# Patient Record
Sex: Male | Born: 1965 | Race: Black or African American | Hispanic: No | Marital: Married | Smoking: Never smoker
Health system: Southern US, Community
[De-identification: ages and names within clinical notes are randomized; demographics above are authoritative.]

## PROBLEM LIST (undated history)

## (undated) DIAGNOSIS — Z789 Other specified health status: Secondary | ICD-10-CM

## (undated) DIAGNOSIS — E7800 Pure hypercholesterolemia, unspecified: Secondary | ICD-10-CM

## (undated) DIAGNOSIS — E119 Type 2 diabetes mellitus without complications: Secondary | ICD-10-CM

---
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
## 1998-08-20 ENCOUNTER — Encounter: Payer: Self-pay | Admitting: Family Medicine

## 1998-08-20 ENCOUNTER — Ambulatory Visit (HOSPITAL_COMMUNITY): Admission: RE | Admit: 1998-08-20 | Discharge: 1998-08-20 | Payer: Self-pay | Admitting: Obstetrics and Gynecology

## 2002-01-10 ENCOUNTER — Encounter: Admission: RE | Admit: 2002-01-10 | Discharge: 2002-04-10 | Payer: Self-pay | Admitting: Family Medicine

## 2012-05-03 ENCOUNTER — Emergency Department (HOSPITAL_COMMUNITY): Payer: BC Managed Care – PPO

## 2012-05-03 ENCOUNTER — Encounter (HOSPITAL_COMMUNITY): Payer: Self-pay | Admitting: *Deleted

## 2012-05-03 ENCOUNTER — Emergency Department (HOSPITAL_COMMUNITY)
Admission: EM | Admit: 2012-05-03 | Discharge: 2012-05-03 | Disposition: A | Discharge status: Home / Self Care | Payer: BC Managed Care – PPO | Attending: Emergency Medicine | Admitting: Emergency Medicine

## 2012-05-03 DIAGNOSIS — S62109A Fracture of unspecified carpal bone, unspecified wrist, initial encounter for closed fracture: Secondary | ICD-10-CM | POA: Insufficient documentation

## 2012-05-03 DIAGNOSIS — T07XXXA Unspecified multiple injuries, initial encounter: Secondary | ICD-10-CM

## 2012-05-03 DIAGNOSIS — S8001XA Contusion of right knee, initial encounter: Secondary | ICD-10-CM

## 2012-05-03 DIAGNOSIS — Y9389 Activity, other specified: Secondary | ICD-10-CM | POA: Insufficient documentation

## 2012-05-03 DIAGNOSIS — S8000XA Contusion of unspecified knee, initial encounter: Secondary | ICD-10-CM | POA: Insufficient documentation

## 2012-05-03 DIAGNOSIS — R071 Chest pain on breathing: Secondary | ICD-10-CM | POA: Insufficient documentation

## 2012-05-03 DIAGNOSIS — R0789 Other chest pain: Secondary | ICD-10-CM

## 2012-05-03 DIAGNOSIS — S62101A Fracture of unspecified carpal bone, right wrist, initial encounter for closed fracture: Secondary | ICD-10-CM

## 2012-05-03 DIAGNOSIS — S8002XA Contusion of left knee, initial encounter: Secondary | ICD-10-CM

## 2012-05-03 DIAGNOSIS — Y92410 Unspecified street and highway as the place of occurrence of the external cause: Secondary | ICD-10-CM | POA: Insufficient documentation

## 2012-05-03 MED ORDER — HYDROMORPHONE HCL PF 1 MG/ML IJ SOLN
1.0000 mg | Freq: Once | INTRAMUSCULAR | Status: AC
  Administered 2012-05-03: 1 mg via INTRAVENOUS
  Filled 2012-05-03: qty 1

## 2012-05-03 MED ORDER — CYCLOBENZAPRINE HCL 10 MG PO TABS: 10.0000 mg | ORAL_TABLET | Freq: Two times a day (BID) | ORAL | Status: DC | PRN

## 2012-05-03 MED ORDER — TETANUS-DIPHTH-ACELL PERTUSSIS 5-2.5-18.5 LF-MCG/0.5 IM SUSP
0.5000 mL | Freq: Once | INTRAMUSCULAR | Status: AC
  Administered 2012-05-03: 0.5 mL via INTRAMUSCULAR
  Filled 2012-05-03: qty 0.5

## 2012-05-03 MED ORDER — OXYCODONE-ACETAMINOPHEN 5-325 MG PO TABS: 1.0000 | ORAL_TABLET | ORAL | Status: DC | PRN

## 2012-05-03 MED ORDER — MELOXICAM 15 MG PO TABS: 15.0000 mg | ORAL_TABLET | Freq: Every day | ORAL | Status: DC

## 2012-05-03 MED ORDER — ONDANSETRON HCL 4 MG/2ML IJ SOLN
4.0000 mg | Freq: Once | INTRAMUSCULAR | Status: AC
  Administered 2012-05-03: 4 mg via INTRAVENOUS
  Filled 2012-05-03: qty 2

## 2012-05-03 MED ORDER — HYDROMORPHONE HCL PF 1 MG/ML IJ SOLN
1.0000 mg | Freq: Once | INTRAMUSCULAR | Status: AC
Start: 2012-05-03 — End: 2012-05-03
  Administered 2012-05-03: 1 mg via INTRAVENOUS
  Filled 2012-05-03: qty 1
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
## 2012-05-03 NOTE — ED Notes (Signed)
Patient transported to X-ray 
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
## 2012-05-04 ENCOUNTER — Encounter (HOSPITAL_BASED_OUTPATIENT_CLINIC_OR_DEPARTMENT_OTHER): Payer: Self-pay | Admitting: *Deleted

## 2012-05-04 ENCOUNTER — Other Ambulatory Visit: Payer: Self-pay | Admitting: Orthopedic Surgery
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
## 2012-05-05 NOTE — ED Provider Notes (Signed)
Medical screening examination/treatment/procedure(s) were performed by non-physician practitioner and as supervising physician I was immediately available for consultation/collaboration.   Juventino Pavone E Stone Spirito, MD 05/05/12 0913 
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
## 2012-05-09 ENCOUNTER — Encounter (HOSPITAL_BASED_OUTPATIENT_CLINIC_OR_DEPARTMENT_OTHER): Payer: Self-pay | Admitting: Anesthesiology

## 2012-05-09 ENCOUNTER — Ambulatory Visit (HOSPITAL_COMMUNITY): Payer: BC Managed Care – PPO

## 2012-05-09 ENCOUNTER — Ambulatory Visit (HOSPITAL_BASED_OUTPATIENT_CLINIC_OR_DEPARTMENT_OTHER)
Admission: RE | Admit: 2012-05-09 | Discharge: 2012-05-09 | Disposition: A | Discharge status: Home / Self Care | Payer: BC Managed Care – PPO | Source: Ambulatory Visit | Attending: Orthopedic Surgery | Admitting: Orthopedic Surgery

## 2012-05-09 ENCOUNTER — Encounter (HOSPITAL_BASED_OUTPATIENT_CLINIC_OR_DEPARTMENT_OTHER)
Admission: RE | Disposition: A | Discharge status: Home / Self Care | Payer: Self-pay | Source: Ambulatory Visit | Attending: Orthopedic Surgery

## 2012-05-09 ENCOUNTER — Encounter (HOSPITAL_BASED_OUTPATIENT_CLINIC_OR_DEPARTMENT_OTHER): Payer: Self-pay | Admitting: *Deleted

## 2012-05-09 ENCOUNTER — Ambulatory Visit (HOSPITAL_BASED_OUTPATIENT_CLINIC_OR_DEPARTMENT_OTHER): Payer: BC Managed Care – PPO | Admitting: Anesthesiology

## 2012-05-09 DIAGNOSIS — S52599A Other fractures of lower end of unspecified radius, initial encounter for closed fracture: Secondary | ICD-10-CM | POA: Insufficient documentation

## 2012-05-09 HISTORY — PX: OPEN REDUCTION INTERNAL FIXATION (ORIF) DISTAL RADIAL FRACTURE: SHX5989

## 2012-05-09 HISTORY — DX: Other specified health status: Z78.9

## 2012-05-09 SURGERY — OPEN REDUCTION INTERNAL FIXATION (ORIF) DISTAL RADIUS FRACTURE
Anesthesia: General | Site: Wrist | Laterality: Right | Wound class: Clean

## 2012-05-09 MED ORDER — LIDOCAINE HCL (CARDIAC) 20 MG/ML IV SOLN
INTRAVENOUS | Status: DC | PRN
  Administered 2012-05-09: 80 mg via INTRAVENOUS

## 2012-05-09 MED ORDER — ONDANSETRON 8 MG PO TBDP
8.0000 mg | ORAL_TABLET | Freq: Once | ORAL | Status: AC
  Administered 2012-05-09: 8 mg via ORAL

## 2012-05-09 MED ORDER — CEFAZOLIN SODIUM-DEXTROSE 2-3 GM-% IV SOLR
2.0000 g | INTRAVENOUS | Status: AC
  Administered 2012-05-09: 3 g via INTRAVENOUS

## 2012-05-09 MED ORDER — ONDANSETRON HCL 4 MG/2ML IJ SOLN: 4.0000 mg | Freq: Once | INTRAMUSCULAR | Status: DC | PRN

## 2012-05-09 MED ORDER — 0.9 % SODIUM CHLORIDE (POUR BTL) OPTIME
TOPICAL | Status: DC | PRN
  Administered 2012-05-09: 1000 mL

## 2012-05-09 MED ORDER — LACTATED RINGERS IV SOLN: INTRAVENOUS | Status: DC

## 2012-05-09 MED ORDER — BUPIVACAINE-EPINEPHRINE PF 0.5-1:200000 % IJ SOLN
INTRAMUSCULAR | Status: DC | PRN
  Administered 2012-05-09: 30 mL

## 2012-05-09 MED ORDER — HYDROMORPHONE HCL PF 1 MG/ML IJ SOLN: 0.2500 mg | INTRAMUSCULAR | Status: DC | PRN

## 2012-05-09 MED ORDER — DEXAMETHASONE SODIUM PHOSPHATE 10 MG/ML IJ SOLN
INTRAMUSCULAR | Status: DC | PRN
  Administered 2012-05-09: 5 mg via INTRAVENOUS

## 2012-05-09 MED ORDER — LACTATED RINGERS IV SOLN
INTRAVENOUS | Status: DC
  Administered 2012-05-09 (×2): via INTRAVENOUS

## 2012-05-09 MED ORDER — MIDAZOLAM HCL 2 MG/2ML IJ SOLN
1.0000 mg | INTRAMUSCULAR | Status: DC | PRN
  Administered 2012-05-09: 2 mg via INTRAVENOUS

## 2012-05-09 MED ORDER — PROPOFOL 10 MG/ML IV BOLUS
INTRAVENOUS | Status: DC | PRN
  Administered 2012-05-09: 250 mg via INTRAVENOUS

## 2012-05-09 MED ORDER — HYDROCODONE-ACETAMINOPHEN 5-325 MG PO TABS: 1.0000 | ORAL_TABLET | ORAL | Status: DC | PRN

## 2012-05-09 MED ORDER — OXYCODONE HCL 5 MG/5ML PO SOLN: 5.0000 mg | Freq: Once | ORAL | Status: DC | PRN

## 2012-05-09 MED ORDER — OXYCODONE-ACETAMINOPHEN 5-325 MG PO TABS: 1.0000 | ORAL_TABLET | ORAL | Status: DC | PRN

## 2012-05-09 MED ORDER — ONDANSETRON HCL 4 MG/2ML IJ SOLN
INTRAMUSCULAR | Status: DC | PRN
  Administered 2012-05-09: 4 mg via INTRAVENOUS

## 2012-05-09 MED ORDER — FENTANYL CITRATE 0.05 MG/ML IJ SOLN
50.0000 ug | Freq: Once | INTRAMUSCULAR | Status: AC
  Administered 2012-05-09: 100 ug via INTRAVENOUS

## 2012-05-09 MED ORDER — DEXAMETHASONE SODIUM PHOSPHATE 4 MG/ML IJ SOLN
INTRAMUSCULAR | Status: DC | PRN
  Administered 2012-05-09: 10 mg via INTRAVENOUS

## 2012-05-09 MED ORDER — HYDROMORPHONE HCL PF 1 MG/ML IJ SOLN: 0.5000 mg | INTRAMUSCULAR | Status: DC | PRN

## 2012-05-09 MED ORDER — OXYCODONE HCL 5 MG PO TABS: 5.0000 mg | ORAL_TABLET | Freq: Once | ORAL | Status: DC | PRN

## 2012-05-09 SURGICAL SUPPLY — 58 items
BANDAGE COBAN STERILE 2 (GAUZE/BANDAGES/DRESSINGS) IMPLANT
BANDAGE GAUZE ELAST BULKY 4 IN (GAUZE/BANDAGES/DRESSINGS) ×4 IMPLANT
BIT DRILL 2 FAST STEP (BIT) ×1 IMPLANT
BIT DRILL 2.5X4 QC (BIT) ×1 IMPLANT
BLADE MINI RND TIP GREEN BEAV (BLADE) IMPLANT
BLADE SURG 15 STRL LF DISP TIS (BLADE) ×1 IMPLANT
BLADE SURG 15 STRL SS (BLADE) ×4
BNDG CMPR 9X4 STRL LF SNTH (GAUZE/BANDAGES/DRESSINGS) ×1
BNDG COHESIVE 4X5 TAN STRL (GAUZE/BANDAGES/DRESSINGS) ×2 IMPLANT
BNDG ESMARK 4X9 LF (GAUZE/BANDAGES/DRESSINGS) ×2 IMPLANT
BRUSH SCRUB EZ PLAIN DRY (MISCELLANEOUS) ×1 IMPLANT
CHLORAPREP W/TINT 26ML (MISCELLANEOUS) ×2 IMPLANT
CORDS BIPOLAR (ELECTRODE) ×2 IMPLANT
COVER MAYO STAND STRL (DRAPES) ×2 IMPLANT
COVER TABLE BACK 60X90 (DRAPES) ×2 IMPLANT
CUFF TOURNIQUET SINGLE 18IN (TOURNIQUET CUFF) IMPLANT
CUFF TOURNIQUET SINGLE 24IN (TOURNIQUET CUFF) ×1 IMPLANT
DRAPE C-ARM 42X72 X-RAY (DRAPES) ×2 IMPLANT
DRAPE EXTREMITY T 121X128X90 (DRAPE) ×2 IMPLANT
DRAPE SURG 17X23 STRL (DRAPES) ×2 IMPLANT
DRSG ADAPTIC 3X8 NADH LF (GAUZE/BANDAGES/DRESSINGS) ×2 IMPLANT
DRSG EMULSION OIL 3X3 NADH (GAUZE/BANDAGES/DRESSINGS) ×1 IMPLANT
GLOVE BIO SURGEON STRL SZ7.5 (GLOVE) ×2 IMPLANT
GLOVE BIOGEL M STRL SZ7.5 (GLOVE) ×2 IMPLANT
GLOVE BIOGEL PI IND STRL 8 (GLOVE) ×1 IMPLANT
GLOVE BIOGEL PI INDICATOR 8 (GLOVE) ×2
GLOVE EXAM NITRILE EXT CUFF MD (GLOVE) ×1 IMPLANT
GOWN PREVENTION PLUS XLARGE (GOWN DISPOSABLE) ×2 IMPLANT
GOWN PREVENTION PLUS XXLARGE (GOWN DISPOSABLE) ×1 IMPLANT
NDL HYPO 25X1 1.5 SAFETY (NEEDLE) IMPLANT
NEEDLE HYPO 25X1 1.5 SAFETY (NEEDLE) IMPLANT
NS IRRIG 1000ML POUR BTL (IV SOLUTION) ×2 IMPLANT
PACK BASIN DAY SURGERY FS (CUSTOM PROCEDURE TRAY) ×2 IMPLANT
PAD CAST 4YDX4 CTTN HI CHSV (CAST SUPPLIES) ×1 IMPLANT
PADDING CAST ABS 4INX4YD NS (CAST SUPPLIES)
PADDING CAST ABS COTTON 4X4 ST (CAST SUPPLIES) IMPLANT
PADDING CAST COTTON 4X4 STRL (CAST SUPPLIES) ×2
PEG SUBCHONDRAL SMOOTH 2.0X20 (Peg) ×3 IMPLANT
PEG SUBCHONDRAL SMOOTH 2.0X22 (Peg) ×3 IMPLANT
PEG SUBCHONDRAL SMOOTH 2.0X24 (Peg) ×1 IMPLANT
PENCIL BUTTON HOLSTER BLD 10FT (ELECTRODE) ×2 IMPLANT
PLATE STAN 24.4X59.5 RT (Plate) ×1 IMPLANT
RUBBERBAND STERILE (MISCELLANEOUS) IMPLANT
SCREW CORT 3.5X14 LNG (Screw) ×2 IMPLANT
SCREW CORT 3.5X16 LNG (Screw) ×1 IMPLANT
SLEEVE SCD COMPRESS KNEE MED (MISCELLANEOUS) ×2 IMPLANT
SPLINT PLASTER CAST XFAST 4X15 (CAST SUPPLIES) IMPLANT
SPLINT PLASTER XTRA FAST SET 4 (CAST SUPPLIES) ×10
SPONGE GAUZE 4X4 12PLY (GAUZE/BANDAGES/DRESSINGS) ×2 IMPLANT
STOCKINETTE 4X48 STRL (DRAPES) ×2 IMPLANT
SUT VIC AB 2-0 CT3 27 (SUTURE) ×2 IMPLANT
SUT VICRYL 4-0 PS2 18IN ABS (SUTURE) IMPLANT
SUT VICRYL RAPIDE 4/0 PS 2 (SUTURE) ×2 IMPLANT
SYR BULB 3OZ (MISCELLANEOUS) ×2 IMPLANT
SYRINGE 10CC LL (SYRINGE) IMPLANT
TOWEL OR 17X24 6PK STRL BLUE (TOWEL DISPOSABLE) ×2 IMPLANT
TOWEL OR NON WOVEN STRL DISP B (DISPOSABLE) ×2 IMPLANT
UNDERPAD 30X30 INCONTINENT (UNDERPADS AND DIAPERS) ×2 IMPLANT

## 2012-05-09 NOTE — Anesthesia Procedure Notes (Addendum)
Anesthesia Regional Block:  Supraclavicular block  Pre-Anesthetic Checklist: ,, timeout performed, Correct Patient, Correct Site, Correct Laterality, Correct Procedure, Correct Position, site marked, Risks and benefits discussed,  Surgical consent,  Pre-op evaluation,  At surgeon's request and post-op pain management  Laterality: Right and Upper  Prep: chloraprep       Needles:  Injection technique: Single-shot  Needle Type: Echogenic Needle     Needle Length: 5cm 5 cm Needle Gauge: 21    Additional Needles:  Procedures: ultrasound guided (picture in chart) Supraclavicular block Narrative:  Start time: 05/09/2012 7:10 AM End time: 05/09/2012 7:18 AM Injection made incrementally with aspirations every 5 mL.  Performed by: Personally  Anesthesiologist: Sheldon Silvan  Supraclavicular block Procedure Name: LMA Insertion Date/Time: 05/09/2012 7:32 AM Performed by: Gar Gibbon Pre-anesthesia Checklist: Patient identified, Emergency Drugs available, Suction available and Patient being monitored Patient Re-evaluated:Patient Re-evaluated prior to inductionOxygen Delivery Method: Circle System Utilized Preoxygenation: Pre-oxygenation with 100% oxygen Intubation Type: IV induction Ventilation: Mask ventilation without difficulty LMA: LMA with gastric port inserted LMA Size: 4.0 Number of attempts: 1 Placement Confirmation: positive ETCO2 Tube secured with: Tape Dental Injury: Teeth and Oropharynx as per pre-operative assessment

## 2012-05-09 NOTE — Anesthesia Preprocedure Evaluation (Addendum)
Anesthesia Evaluation  Patient identified by MRN, date of birth, ID band Patient awake    Reviewed: Allergy & Precautions, H&P , NPO status , Patient's Chart, lab work & pertinent test results  Airway Mallampati: I TM Distance: >3 FB Neck ROM: Full    Dental  (+) Teeth Intact and Dental Advisory Given   Pulmonary  breath sounds clear to auscultation        Cardiovascular Rhythm:Regular Rate:Normal     Neuro/Psych    GI/Hepatic   Endo/Other  Morbid obesity  Renal/GU      Musculoskeletal   Abdominal   Peds  Hematology   Anesthesia Other Findings   Reproductive/Obstetrics                          Anesthesia Physical Anesthesia Plan  ASA: II  Anesthesia Plan: General   Post-op Pain Management:    Induction: Intravenous  Airway Management Planned: LMA  Additional Equipment:   Intra-op Plan:   Post-operative Plan: Extubation in OR  Informed Consent: I have reviewed the patients History and Physical, chart, labs and discussed the procedure including the risks, benefits and alternatives for the proposed anesthesia with the patient or authorized representative who has indicated his/her understanding and acceptance.   Dental advisory given  Plan Discussed with: CRNA, Anesthesiologist and Surgeon  Anesthesia Plan Comments:         Anesthesia Quick Evaluation

## 2012-05-09 NOTE — Interval H&P Note (Signed)
History and Physical Interval Note:  05/09/2012 7:22 AM  Jonathon Owens  has presented today for surgery, with the diagnosis of RIGHT DITAL RADIUS FRACTURE  The various methods of treatment have been discussed with the patient and family. After consideration of risks, benefits and other options for treatment, the patient has consented to  Procedure(s) (LRB) with comments: OPEN REDUCTION INTERNAL FIXATION (ORIF) DISTAL RADIAL FRACTURE (Right) as a surgical intervention .  The patient's history has been reviewed, patient examined, no change in status, stable for surgery.  I have reviewed the patient's chart and labs.  Questions were answered to the patient's satisfaction.     Hence [name]

## 2012-05-09 NOTE — Transfer of Care (Signed)
Immediate Anesthesia Transfer of Care Note  Patient: Jonathon Owens  Procedure(s) Performed: Procedure(s) (LRB) with comments: OPEN REDUCTION INTERNAL FIXATION (ORIF) DISTAL RADIAL FRACTURE (Right)  Patient Location: PACU  Anesthesia Type:GA combined with regional for post-op pain  Level of Consciousness: awake and patient cooperative  Airway & Oxygen Therapy: Patient Spontanous Breathing and Patient connected to face mask oxygen  Post-op Assessment: Report given to PACU RN and Post -op Vital signs reviewed and stable  Post vital signs: Reviewed and stable  Complications: No apparent anesthesia complications

## 2012-05-09 NOTE — Progress Notes (Signed)
Assisted Dr. Crews with right, ultrasound guided, supraclavicular block. Side rails up, monitors on throughout procedure. See vital signs in flow sheet. Tolerated Procedure well. 

## 2012-05-09 NOTE — Anesthesia Postprocedure Evaluation (Signed)
  Anesthesia Post-op Note  Patient: Jonathon Owens  Procedure(s) Performed: Procedure(s) (LRB) with comments: OPEN REDUCTION INTERNAL FIXATION (ORIF) DISTAL RADIAL FRACTURE (Right)  Patient Location: PACU  Anesthesia Type:GA combined with regional for post-op pain  Level of Consciousness: awake, alert  and oriented  Airway and Oxygen Therapy: Patient Spontanous Breathing and Patient connected to face mask oxygen  Post-op Pain: none  Post-op Assessment: Post-op Vital signs reviewed  Post-op Vital Signs: Reviewed  Complications: No apparent anesthesia complications
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
## 2012-05-10 ENCOUNTER — Encounter (HOSPITAL_BASED_OUTPATIENT_CLINIC_OR_DEPARTMENT_OTHER): Payer: Self-pay | Admitting: Orthopedic Surgery
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
## 2014-10-10 IMAGING — RF DG C-ARM 61-120 MIN
1 series · 3 of 3 positions shown · non-contrast
Comparison: Right wrist radiographs - 05/03/2012

CLINICAL DATA: ORIF of the right wrist

DG C-ARM 1-60 MIN,RIGHT WRIST - 2 VIEW

[Series 1: run · 3 of 3 slices shown]
[im 1/3]
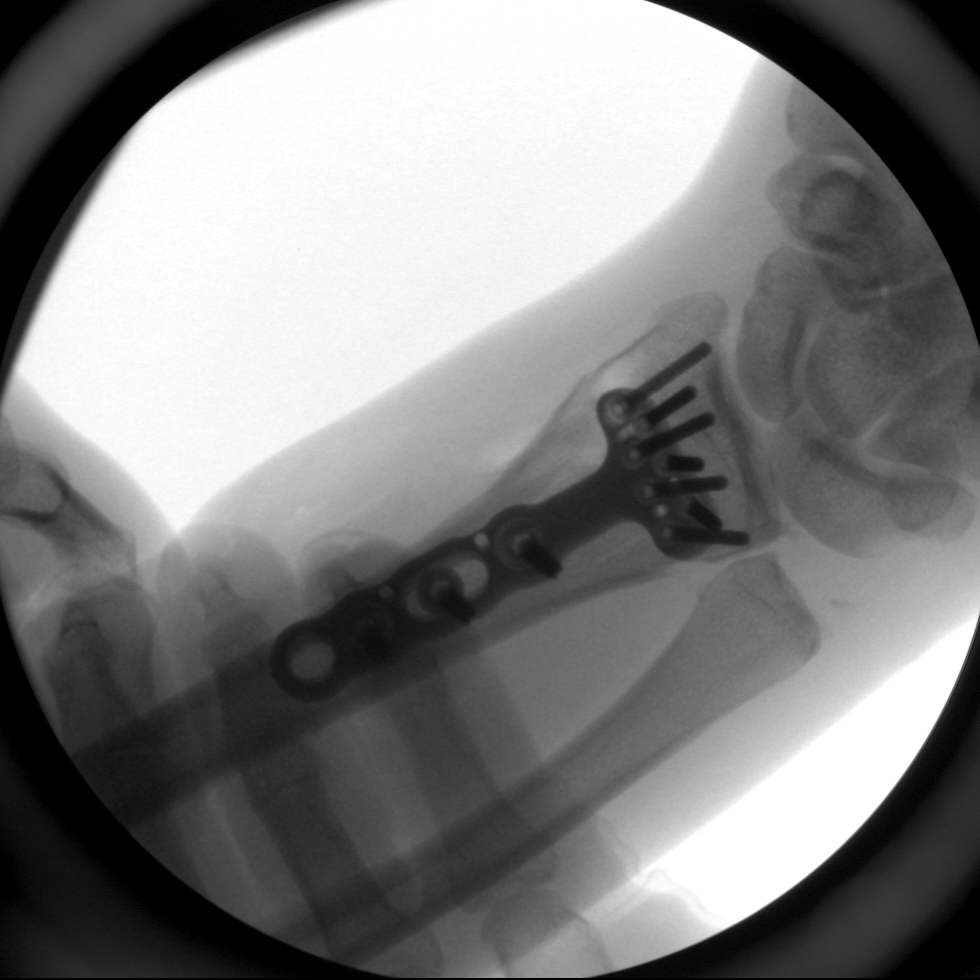
[im 2/3]
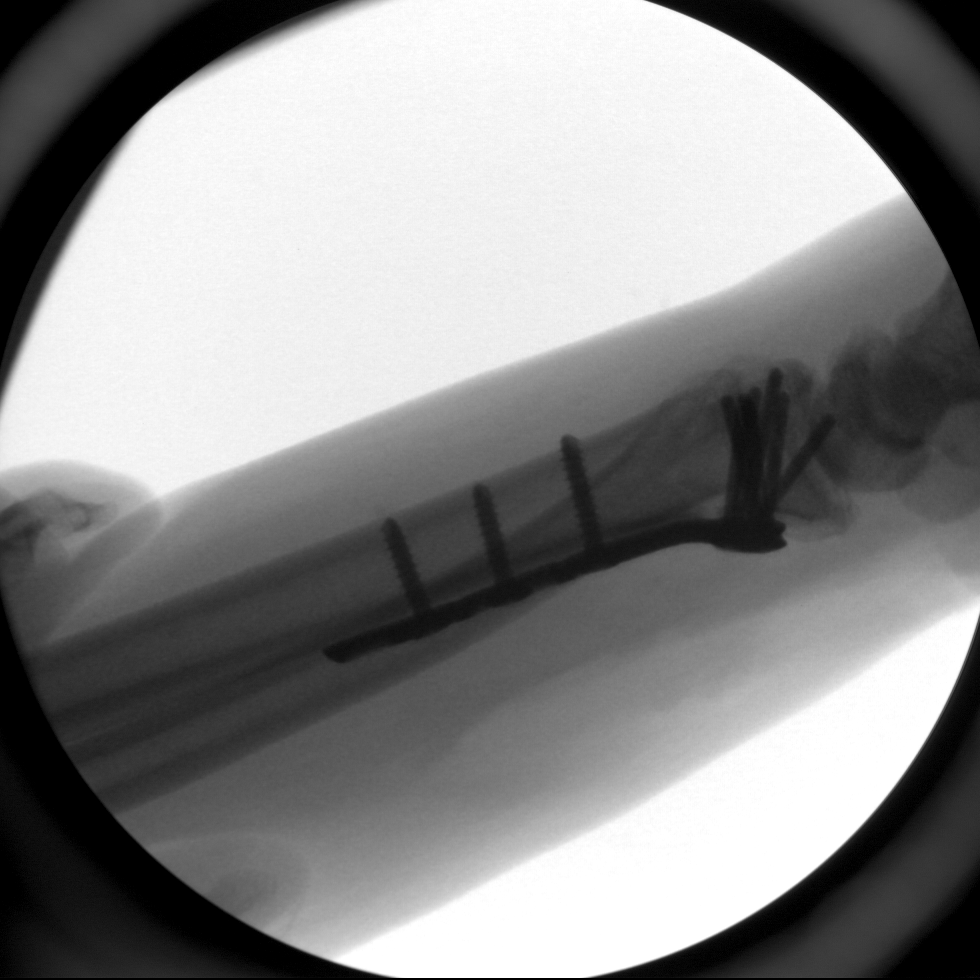
[im 3/3]
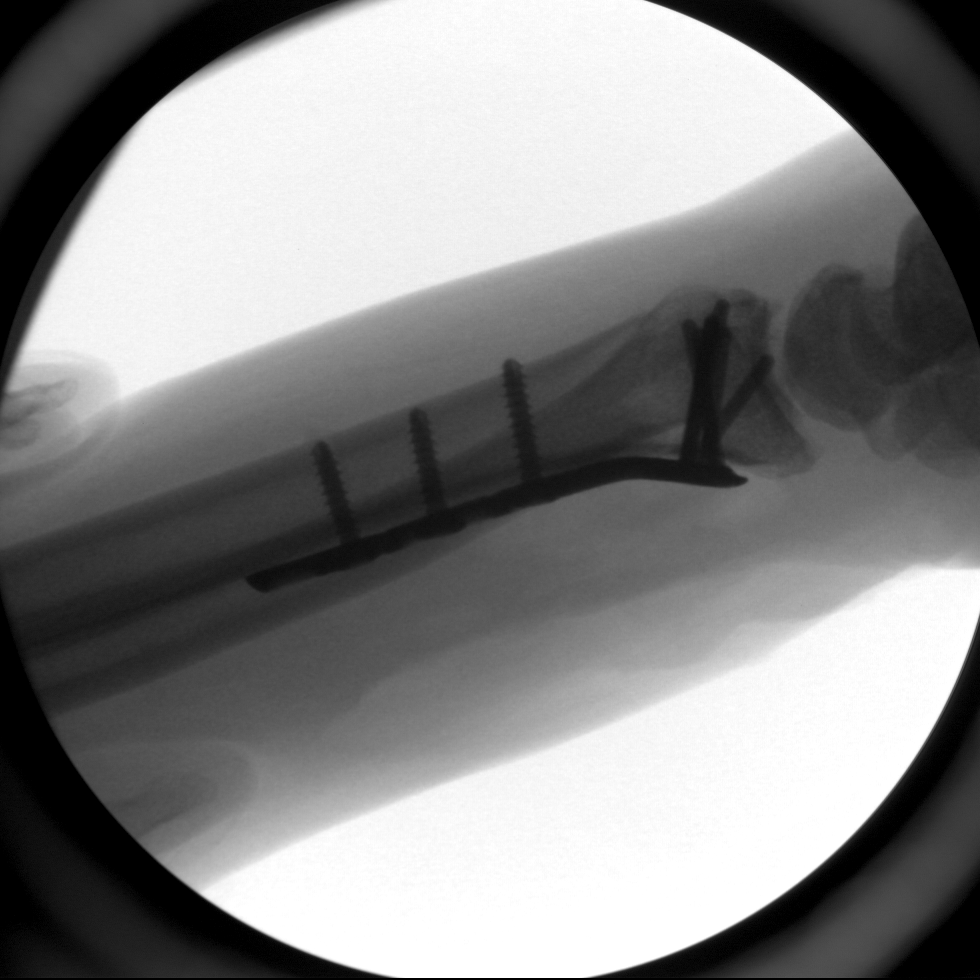

[3 of 3 positions shown; findings below may reference images not displayed]

FINDINGS: Three spot intraoperative radiographic images of the right wrist
are provided for review.

Images demonstrate side plate fixation of previously noted
comminuted distal radial metaphyseal fracture with intra-articular
extension.  Alignment appears near anatomic.  There is a minimal
amount of this expected subcutaneous emphysema about the operative
site.  No radiopaque foreign body.

Grossly unchanged minimally displaced ulnar styloid process
fracture.
IMPRESSION: Post ORIF of previously noted comminuted distal radial fracture
without evidence of complication.

## 2016-03-01 ENCOUNTER — Emergency Department (HOSPITAL_COMMUNITY)
Admission: EM | Admit: 2016-03-01 | Discharge: 2016-03-01 | Disposition: A | Discharge status: Home / Self Care | Payer: BLUE CROSS/BLUE SHIELD | Attending: Emergency Medicine | Admitting: Emergency Medicine

## 2016-03-01 ENCOUNTER — Encounter (HOSPITAL_COMMUNITY): Payer: Self-pay | Admitting: Emergency Medicine

## 2016-03-01 DIAGNOSIS — Y999 Unspecified external cause status: Secondary | ICD-10-CM | POA: Insufficient documentation

## 2016-03-01 DIAGNOSIS — Y929 Unspecified place or not applicable: Secondary | ICD-10-CM | POA: Diagnosis not present

## 2016-03-01 DIAGNOSIS — W272XXA Contact with scissors, initial encounter: Secondary | ICD-10-CM | POA: Insufficient documentation

## 2016-03-01 DIAGNOSIS — S61212A Laceration without foreign body of right middle finger without damage to nail, initial encounter: Secondary | ICD-10-CM | POA: Insufficient documentation

## 2016-03-01 DIAGNOSIS — S6991XA Unspecified injury of right wrist, hand and finger(s), initial encounter: Secondary | ICD-10-CM | POA: Diagnosis present

## 2016-03-01 DIAGNOSIS — Y939 Activity, unspecified: Secondary | ICD-10-CM | POA: Diagnosis not present

## 2016-03-01 NOTE — Discharge Instructions (Signed)
Please read the attached instructions for home care. Of your laceration. Patient to wear your finger splint to protect the end of the finger as it heals. If you develop any signs of infection including increased pain, swelling, discharge from the wound, pus, fevers or chills. Return to the emergency Department or see her primary care physician in follow-up. Gently cleanse the wound twice daily with antibacterial soap and water. Make sure to change into clean bandages.

## 2016-03-01 NOTE — ED Provider Notes (Signed)
[facility]EM[facility]Y DEPT Provider Note   CSN: [id number] Arrival date & time: 03/01/16  [id number]  By signing my name below, I, Freida Busmaniana Omoyeni, attest that this documentation has been prepared under the direction and in the presence of Arthor CaptainAbigail Fama Muenchow, PA-C. Electronically Signed: Freida Busmaniana Omoyeni, Scribe. 03/01/2016. 10:12 AM.  History   Chief Complaint Chief Complaint  Patient presents with  . Finger Injury    The history is provided by the patient and medical records. No language interpreter was used.    HPI Comments:  Jonathon Owens is a 50 y.o. male who presents to the Emergency Department complaining of a laceration to the right middle finger which he sustained ~1845 last night. He was using scissors to cut open a box when he injured. Wound was cleaned and bleeding was controlled with pressure last night. Pt believes he may have had his tetanus updated 3 years ago. Per chart review, pt's tetanus was updated on 05/03/2012  Past Medical History:  Diagnosis Date  . No pertinent past medical history     There are no active problems to display for this patient.   Past Surgical History:  Procedure Laterality Date  . OPEN REDUCTION INTERNAL FIXATION (ORIF) DISTAL RADIAL FRACTURE  05/09/2012   Procedure: OPEN REDUCTION INTERNAL FIXATION (ORIF) DISTAL RADIAL FRACTURE;  Surgeon: Jodi Marbleavid A Thompson, MD;  Location: [facility];  Service: Orthopedics;  Laterality: Right;  . PILONIDAL CYST EXCISION  1985   back       Home Medications    Prior to Admission medications   Medication Sig Start Date End Date Taking? Authorizing Provider  cyclobenzaprine (FLEXERIL) 10 MG tablet Take 1 tablet (10 mg total) by mouth 2 (two) times daily as needed for muscle spasms. 05/03/12   Tatyana Kirichenko, PA-C  meloxicam (MOBIC) 15 MG tablet Take 1 tablet (15 mg total) by mouth daily. 05/03/12   Tatyana Kirichenko, PA-C  oxyCODONE-acetaminophen (PERCOCET) 5-325 MG per tablet Take 1 tablet by mouth every  4 (four) hours as needed for pain. 05/03/12   Jaynie Crumbleatyana Kirichenko, PA-C    Family History No family history on file.  Social History Social History  Substance Use Topics  . Smoking status: Never Smoker  . Smokeless tobacco: Never Used  . Alcohol use No     Allergies   Patient has no known allergies.   Review of Systems Review of Systems  Constitutional: Negative for fever.  Skin: Positive for wound.  Neurological: Negative for weakness and numbness.     Physical Exam Updated Vital Signs BP 126/68 (BP Location: Right Arm)   Pulse 80   Temp 97.6 F (36.4 C) (Oral)   Resp 16   SpO2 100%   Physical Exam  Constitutional: He is oriented to person, place, and time. He appears well-developed and well-nourished. No distress.  HENT:  Head: Normocephalic and atraumatic.  Eyes: Conjunctivae are normal.  Cardiovascular: Normal rate.   Pulmonary/Chest: Effort normal.  Abdominal: He exhibits no distension.  Neurological: He is alert and oriented to person, place, and time.  Skin: Skin is warm and dry. Capillary refill takes less than 2 seconds.  1.5 cm cutaneous laceration to the 3rd finger on the right hand   Does not involve nail bed NVI FROM and strength in that finger   Psychiatric: He has a normal mood and affect.  Nursing note and vitals reviewed.    ED Treatments / Results  DIAGNOSTIC STUDIES:  Oxygen Saturation is 100% on RA, normal by my interpretation.  COORDINATION OF CARE:  10:00 AM Discussed treatment plan with pt at bedside and pt agreed to plan.  Labs (all labs ordered are listed, but only abnormal results are displayed) Labs Reviewed - No data to display  EKG  EKG Interpretation None       Radiology No results found.  Procedures Procedures (including critical care time)  Medications Ordered in ED Medications - No data to display   Initial Impression / Assessment and Plan / ED Course  I have reviewed the triage vital signs and the  nursing notes.  Pertinent labs & imaging results that were available during my care of the patient were reviewed by me and considered in my medical decision making (see chart for details).  Clinical Course      Tetanus Up-to-date . Laceration occurred > 12 hours prior to arrival, therefore, unable to close the wound .,Discussed wound care with pt and answered questions. Pt to f-u for  wound check sooner should there be signs of dehiscence or infection. Pt is hemodynamically stable with no complaints prior to dc.    Final Clinical Impressions(s) / ED Diagnoses   Final diagnoses:  Laceration of right middle finger without foreign body without damage to nail, initial encounter    New Prescriptions New Prescriptions   No medications on file   I personally performed the services described in this documentation, which was scribed in my presence. The recorded information has been reviewed and is accurate.        Arthor Captainbigail Ankit Degregorio, PA-C 03/01/16 1017    Mancel BaleElliott Wentz, MD 03/01/16 1600

## 2016-03-01 NOTE — ED Triage Notes (Signed)
Laceration to right middle finger tip with scissors yesterday evening around 1900. Did not seek treatment then because "I had to go to work". Flap lac to tip of finger. No bleeding at present.

## 2016-06-24 ENCOUNTER — Ambulatory Visit: Payer: BLUE CROSS/BLUE SHIELD | Admitting: Dietician

## 2016-06-25 ENCOUNTER — Encounter: Payer: BLUE CROSS/BLUE SHIELD | Attending: Internal Medicine | Admitting: Dietician

## 2016-06-25 ENCOUNTER — Encounter: Payer: Self-pay | Admitting: Dietician

## 2016-06-25 VITALS — Ht 68.5 in | Wt 290.0 lb

## 2016-06-25 DIAGNOSIS — E119 Type 2 diabetes mellitus without complications: Secondary | ICD-10-CM

## 2016-06-25 DIAGNOSIS — E7800 Pure hypercholesterolemia, unspecified: Secondary | ICD-10-CM

## 2016-06-25 DIAGNOSIS — Z713 Dietary counseling and surveillance: Secondary | ICD-10-CM | POA: Diagnosis not present

## 2016-06-25 NOTE — Progress Notes (Signed)
Diabetes Self-Management Education  Visit Type:  Initial  Appt. Start Time: 8:00  Appt. End Time: 9:00  06/25/2016  Mr. Jonathon Owens, identified by name and date of birth, is a 51 y.o. male with a diagnosis of Diabetes:  .   ASSESSMENT  Height 5' 8.5" (1.74 m), weight 290 lb (131.5 kg). Body mass index is 43.45 kg/m.    Individualized Plan for Diabetes Self-Management Training:   Learning Objective:  Patient will have a greater understanding of diabetes self-management. Patient education plan is to attend individual and/or group sessions per assessed needs and concerns.  Dietary Recall B: cereal (cheerios) if at home, Egg Mcmuffin from [employer] if out ... Used to be country ham biscuit daily L: salad from [employer] (spicy chicken with grilled chicken) often D: used to go 12-14hrs without eating d/t old job, now has more flexibility with meals. Grilled chicken, fish, occasionally steak, salad/ vegetables, rice occasionally. Eliminated fast food burgers Snacks: fruit during the day B: lipton diet tea, water, has cut out diet sodas    Plan:   Patient expresses readiness to learn about diabetes and how to manage it. CBW reflects 6# wt gain from wt of 284# recorded (04/16/16). Labs (04/16/16) HgbA1c 10.9 H, Cholesterol 242 H, Triglycerides 215 H, LDL 152 H, GLU 372 H. Diagnosed with diabetes 2 months ago. Per patient, DM runs in his family. Reports starting on Lantus medication on Monday. No questions concerning meds at this time. Describes being an "all or nothing" type of person when it comes to eating. Will have either have nothing or several servings. Guidelines for proper portion sizes of all food groups and servings of CHO in relation to DM was reviewed. Dietary interventions to date include limiting bread consumption, increasing water intake especially when eating out, limiting/avoiding fried foods. Current exercise regimen: walking on the treadmill 3x/wk for 60min with plans to  increase # of days within the next month. Importance of weight reduction and physical activity in the management of DM was emphasized. Other areas covered: CHO sources, CHO counting, translation of HgbA1c to DM management, healthy snack ideas, fiber in relation to DM and heart health, overall DM II nutrition therapy. Patient will practice CHO counting, food substitutions, eating at regular time intervals and having consistent amounts of CHO throughout the day.  Expected Outcomes:   Improved HgbA1c, improved short-term BG readings, weight reduction  Education material provided: My Plate and Carbohydrate counting sheet, DM type II Nutrition Therapy, Fiber and Diabetes   If problems or questions, patient to contact team via:  Phone and Email  Future DSME appointment:  4 weeks (07/23/16)

## 2016-06-25 NOTE — Patient Instructions (Addendum)
-   Add an evening snack with 1-2 servings of carbohydrates to keep blood sugar from dropping too low overnight  - Choose the Egg White Delight sandwich from [employer] a few times per week as a substitute for the Egg McMuffins  - Practice carbohydrate counting and use measuring cups/ a scale as needed to get an idea for what servings of carbs look like  - Choose lower fat dairy products and continue to use cooking methods like baking or grilling. Great job!  - Consider increasing exercise from 3 days/ wk to 5 days/ wk

## 2016-06-28 ENCOUNTER — Emergency Department (HOSPITAL_COMMUNITY)
Admission: EM | Admit: 2016-06-28 | Discharge: 2016-06-28 | Disposition: A | Discharge status: Home / Self Care | Payer: BLUE CROSS/BLUE SHIELD | Attending: Emergency Medicine | Admitting: Emergency Medicine

## 2016-06-28 ENCOUNTER — Encounter (HOSPITAL_COMMUNITY): Payer: Self-pay | Admitting: Emergency Medicine

## 2016-06-28 DIAGNOSIS — Z794 Long term (current) use of insulin: Secondary | ICD-10-CM | POA: Insufficient documentation

## 2016-06-28 DIAGNOSIS — E119 Type 2 diabetes mellitus without complications: Secondary | ICD-10-CM | POA: Insufficient documentation

## 2016-06-28 DIAGNOSIS — Z79899 Other long term (current) drug therapy: Secondary | ICD-10-CM | POA: Diagnosis not present

## 2016-06-28 DIAGNOSIS — R42 Dizziness and giddiness: Secondary | ICD-10-CM | POA: Insufficient documentation

## 2016-06-28 HISTORY — DX: Pure hypercholesterolemia, unspecified: E78.00

## 2016-06-28 HISTORY — DX: Type 2 diabetes mellitus without complications: E11.9

## 2016-06-28 LAB — BASIC METABOLIC PANEL
ANION GAP: 12 (ref 5–15)
BUN: 10 mg/dL (ref 6–20)
CALCIUM: 8.7 mg/dL — AB (ref 8.9–10.3)
CHLORIDE: 104 mmol/L (ref 101–111)
CO2: 24 mmol/L (ref 22–32)
CREATININE: 0.78 mg/dL (ref 0.61–1.24)
GFR calc Af Amer: >60 mL/min (ref >60)
GFR calc non Af Amer: >60 mL/min (ref >60)
Glucose, Bld: 214 mg/dL — ABNORMAL HIGH (ref 65–99)
Potassium: 3.9 mmol/L (ref 3.5–5.1)
SODIUM: 140 mmol/L (ref 135–145)

## 2016-06-28 LAB — CBC
HCT: 40.8 % (ref 39.0–52.0)
Hemoglobin: 13.9 g/dL (ref 13.0–17.0)
MCH: 28.5 pg (ref 26.0–34.0)
MCHC: 34.1 g/dL (ref 30.0–36.0)
MCV: 83.8 fL (ref 78.0–100.0)
PLATELETS: 269 10*3/uL (ref 150–400)
RBC: 4.87 MIL/uL (ref 4.22–5.81)
RDW: 13.2 % (ref 11.5–15.5)
WBC: 6.1 10*3/uL (ref 4.0–10.5)

## 2016-06-28 LAB — CBG MONITORING, ED: Glucose-Capillary: 210 mg/dL — ABNORMAL HIGH (ref 65–99)

## 2016-06-28 MED ORDER — LORAZEPAM 2 MG/ML IJ SOLN
0.5000 mg | Freq: Once | INTRAMUSCULAR | Status: AC
  Administered 2016-06-28: 0.5 mg via INTRAVENOUS
  Filled 2016-06-28: qty 1

## 2016-06-28 MED ORDER — MECLIZINE HCL 25 MG PO TABS: 25.0000 mg | ORAL_TABLET | Freq: Three times a day (TID) | ORAL | 0 refills | Status: DC | PRN

## 2016-06-28 MED ORDER — MECLIZINE HCL 25 MG PO TABS
25.0000 mg | ORAL_TABLET | Freq: Once | ORAL | Status: AC
  Administered 2016-06-28: 25 mg via ORAL
  Filled 2016-06-28: qty 1

## 2016-06-28 MED ORDER — ONDANSETRON HCL 4 MG/2ML IJ SOLN
4.0000 mg | Freq: Once | INTRAMUSCULAR | Status: AC
  Administered 2016-06-28: 4 mg via INTRAVENOUS
  Filled 2016-06-28: qty 2

## 2016-06-28 MED ORDER — DIPHENHYDRAMINE HCL 50 MG/ML IJ SOLN
12.5000 mg | Freq: Once | INTRAMUSCULAR | Status: AC
  Administered 2016-06-28: 12.5 mg via INTRAVENOUS
  Filled 2016-06-28: qty 1

## 2016-06-28 NOTE — ED Provider Notes (Signed)
[facility] Provider Note   CSN: [id number] Arrival date & time: 06/28/16  1239     History   Chief Complaint Chief Complaint  Patient presents with  . Dizziness  . Emesis    HPI Jonathon Owens is a 51 y.o. male.  51 year old male presents with acute onset of dizziness characterized as a room spinning and worse with certain movements of his head. Denies any severe headaches. Has had nausea and vomiting with it. No chest pain or shortness of breath. No abdominal discomfort. No recent cold or flu illness or ear pain. Symptoms better with not moving his head and keeping his eyes closed. No treatment use prior to arrival. No prior history of same      Past Medical History:  Diagnosis Date  . Diabetes mellitus without complication (HCC)   . High cholesterol   . No pertinent past medical history     There are no active problems to display for this patient.   Past Surgical History:  Procedure Laterality Date  . OPEN REDUCTION INTERNAL FIXATION (ORIF) DISTAL RADIAL FRACTURE  05/09/2012   Procedure: OPEN REDUCTION INTERNAL FIXATION (ORIF) DISTAL RADIAL FRACTURE;  Surgeon: Jodi Marble, MD;  Location: [facility];  Service: Orthopedics;  Laterality: Right;  . PILONIDAL CYST EXCISION  1985   back       Home Medications    Prior to Admission medications   Medication Sig Start Date End Date Taking? Authorizing Provider  cyclobenzaprine (FLEXERIL) 10 MG tablet Take 1 tablet (10 mg total) by mouth 2 (two) times daily as needed for muscle spasms. Patient not taking: Reported on 06/25/2016 05/03/12   Lemont Fillers Kirichenko, PA-C  insulin glargine (LANTUS) 100 UNIT/ML injection Inject 14 Units into the skin at bedtime.    Historical Provider, MD  meloxicam (MOBIC) 15 MG tablet Take 1 tablet (15 mg total) by mouth daily. Patient not taking: Reported on 06/25/2016 05/03/12   Jaynie Crumble, PA-C  oxyCODONE-acetaminophen (PERCOCET) 5-325 MG per tablet Take 1  tablet by mouth every 4 (four) hours as needed for pain. Patient not taking: Reported on 06/25/2016 05/03/12   Jaynie Crumble, PA-C    Family History No family history on file.  Social History Social History  Substance Use Topics  . Smoking status: Never Smoker  . Smokeless tobacco: Never Used  . Alcohol use No     Allergies   Patient has no known allergies.   Review of Systems Review of Systems  All other systems reviewed and are negative.    Physical Exam Updated Vital Signs BP 129/77   Pulse 82   Temp 97.8 F (36.6 C) (Oral)   Resp 20   Ht 5' 8.5" (1.74 m)   Wt 131.5 kg   SpO2 100%   BMI 43.45 kg/m   Physical Exam  Constitutional: He is oriented to person, place, and time. He appears well-developed and well-nourished.  Non-toxic appearance. No distress.  HENT:  Head: Normocephalic and atraumatic.  Eyes: Conjunctivae, EOM and lids are normal. Pupils are equal, round, and reactive to light.  Neck: Normal range of motion. Neck supple. No tracheal deviation present. No thyroid mass present.  Cardiovascular: Normal rate, regular rhythm and normal heart sounds.  Exam reveals no gallop.   No murmur heard. Pulmonary/Chest: Effort normal and breath sounds normal. No stridor. No respiratory distress. He has no decreased breath sounds. He has no wheezes. He has no rhonchi. He has no rales.  Abdominal: Soft. Normal appearance and  bowel sounds are normal. He exhibits no distension. There is no tenderness. There is no rebound and no CVA tenderness.  Musculoskeletal: Normal range of motion. He exhibits no edema or tenderness.  Neurological: He is alert and oriented to person, place, and time. He has normal strength. He displays no tremor. No cranial nerve deficit or sensory deficit. Coordination normal. GCS eye subscore is 4. GCS verbal subscore is 5. GCS motor subscore is 6.  Positive horizontal nystagmus noted  Skin: Skin is warm and dry. No abrasion and no rash noted.    Psychiatric: He has a normal mood and affect. His speech is normal and behavior is normal.  Nursing note and vitals reviewed.    ED Treatments / Results  Labs (all labs ordered are listed, but only abnormal results are displayed) Labs Reviewed  BASIC METABOLIC PANEL - Abnormal; Notable for the following:       Result Value   Glucose, Bld 214 (*)    Calcium 8.7 (*)    All other components within normal limits  CBG MONITORING, ED - Abnormal; Notable for the following:    Glucose-Capillary 210 (*)    All other components within normal limits  CBC  URINALYSIS, ROUTINE W REFLEX MICROSCOPIC    EKG  EKG Interpretation  Date/Time:  Sunday June 28 2016 12:43:59 EDT Ventricular Rate:  79 PR Interval:  166 QRS Duration: 94 QT Interval:  394 QTC Calculation: 451 R Axis:   -38 Text Interpretation:  Normal sinus rhythm Left axis deviation Abnormal ECG Confirmed by Enoc Getter  MD, Lukah Goswami ([id number]) on 06/28/2016 1:33:05 PM       Radiology No results found.  Procedures Procedures (including critical care time)  Medications Ordered in ED Medications  LORazepam (ATIVAN) injection 0.5 mg (not administered)  ondansetron (ZOFRAN) injection 4 mg (not administered)  diphenhydrAMINE (BENADRYL) injection 12.5 mg (not administered)  meclizine (ANTIVERT) tablet 25 mg (not administered)     Initial Impression / Assessment and Plan / ED Course  I have reviewed the triage vital signs and the nursing notes.  Pertinent labs & imaging results that were available during my care of the patient were reviewed by me and considered in my medical decision making (see chart for details).     Patient treated for vertigo here and feels better. Feels that this is likely peripheral in etiology and not central. He has no focal neurological deficits. Will place on meclizine and return precautions given  Final Clinical Impressions(s) / ED Diagnoses   Final diagnoses:  None    New Prescriptions New  Prescriptions   No medications on file     Lorre NickAnthony Ayoub Arey, MD 06/28/16 1457

## 2016-06-28 NOTE — ED Triage Notes (Signed)
Pt c/o onset at about 1 am with dizziness, vomiting x 2. Pt continues to have dizziness and nausea.

## 2016-07-22 ENCOUNTER — Ambulatory Visit: Payer: BLUE CROSS/BLUE SHIELD | Admitting: Registered"

## 2019-05-09 NOTE — Telephone Encounter (Signed)
This encounter was created in error - please disregard.

## 2022-09-10 ENCOUNTER — Ambulatory Visit: Payer: BC Managed Care – PPO | Admitting: Emergency Medicine

## 2022-09-10 ENCOUNTER — Encounter: Payer: Self-pay | Admitting: Emergency Medicine

## 2022-09-10 VITALS — BP 160/100 | HR 100 | Temp 98.2°F | Ht 68.5 in | Wt 277.2 lb

## 2022-09-10 DIAGNOSIS — Z6841 Body Mass Index (BMI) 40.0 and over, adult: Secondary | ICD-10-CM

## 2022-09-10 DIAGNOSIS — E1165 Type 2 diabetes mellitus with hyperglycemia: Secondary | ICD-10-CM | POA: Diagnosis not present

## 2022-09-10 DIAGNOSIS — Z1211 Encounter for screening for malignant neoplasm of colon: Secondary | ICD-10-CM

## 2022-09-10 DIAGNOSIS — Z23 Encounter for immunization: Secondary | ICD-10-CM

## 2022-09-10 DIAGNOSIS — I10 Essential (primary) hypertension: Secondary | ICD-10-CM | POA: Diagnosis not present

## 2022-09-10 DIAGNOSIS — Z7689 Persons encountering health services in other specified circumstances: Secondary | ICD-10-CM

## 2022-09-10 DIAGNOSIS — Z7985 Long-term (current) use of injectable non-insulin antidiabetic drugs: Secondary | ICD-10-CM

## 2022-09-10 DIAGNOSIS — Z7984 Long term (current) use of oral hypoglycemic drugs: Secondary | ICD-10-CM

## 2022-09-10 LAB — LIPID PANEL
Cholesterol: 246 mg/dL — ABNORMAL HIGH (ref 0–200)
HDL: 45.6 mg/dL (ref >39.00)
LDL Cholesterol: 184 mg/dL — ABNORMAL HIGH (ref 0–99)
NonHDL: 200.83
Total CHOL/HDL Ratio: 5
Triglycerides: 82 mg/dL (ref 0.0–149.0)
VLDL: 16.4 mg/dL (ref 0.0–40.0)

## 2022-09-10 LAB — CBC WITH DIFFERENTIAL/PLATELET
Basophils Absolute: 0.1 10*3/uL (ref 0.0–0.1)
Basophils Relative: 1.2 % (ref 0.0–3.0)
Eosinophils Absolute: 0.1 10*3/uL (ref 0.0–0.7)
Eosinophils Relative: 2.5 % (ref 0.0–5.0)
HCT: 45.2 % (ref 39.0–52.0)
Hemoglobin: 14.7 g/dL (ref 13.0–17.0)
Lymphocytes Relative: 30.9 % (ref 12.0–46.0)
Lymphs Abs: 1.6 10*3/uL (ref 0.7–4.0)
MCHC: 32.6 g/dL (ref 30.0–36.0)
MCV: 85.1 fl (ref 78.0–100.0)
Monocytes Absolute: 0.4 10*3/uL (ref 0.1–1.0)
Monocytes Relative: 7.8 % (ref 3.0–12.0)
Neutro Abs: 3 10*3/uL (ref 1.4–7.7)
Neutrophils Relative %: 57.6 % (ref 43.0–77.0)
Platelets: 319 10*3/uL (ref 150.0–400.0)
RBC: 5.3 Mil/uL (ref 4.22–5.81)
RDW: 14.1 % (ref 11.5–15.5)
WBC: 5.2 10*3/uL (ref 4.0–10.5)

## 2022-09-10 LAB — POCT GLYCOSYLATED HEMOGLOBIN (HGB A1C): Hemoglobin A1C: 11.7 % — AB (ref 4.0–5.6)

## 2022-09-10 LAB — COMPREHENSIVE METABOLIC PANEL
ALT: 18 U/L (ref 0–53)
AST: 13 U/L (ref 0–37)
Albumin: 3.9 g/dL (ref 3.5–5.2)
Alkaline Phosphatase: 101 U/L (ref 39–117)
BUN: 16 mg/dL (ref 6–23)
CO2: 30 mEq/L (ref 19–32)
Calcium: 9 mg/dL (ref 8.4–10.5)
Chloride: 100 mEq/L (ref 96–112)
Creatinine, Ser: 0.93 mg/dL (ref 0.40–1.50)
GFR: 91.33 mL/min (ref >60.00)
Glucose, Bld: 301 mg/dL — ABNORMAL HIGH (ref 70–99)
Potassium: 4.1 mEq/L (ref 3.5–5.1)
Sodium: 137 mEq/L (ref 135–145)
Total Bilirubin: 0.6 mg/dL (ref 0.2–1.2)
Total Protein: 7.2 g/dL (ref 6.0–8.3)

## 2022-09-10 LAB — MICROALBUMIN / CREATININE URINE RATIO
Creatinine,U: 118.5 mg/dL
Microalb Creat Ratio: 52 mg/g — ABNORMAL HIGH (ref 0.0–30.0)
Microalb, Ur: 61.7 mg/dL — ABNORMAL HIGH (ref 0.0–1.9)

## 2022-09-10 MED ORDER — EMPAGLIFLOZIN 10 MG PO TABS
10.0000 mg | ORAL_TABLET | Freq: Every day | ORAL | 3 refills | Status: AC
Start: 2022-09-10 — End: ?

## 2022-09-10 MED ORDER — VALSARTAN-HYDROCHLOROTHIAZIDE 160-12.5 MG PO TABS
1.0000 | ORAL_TABLET | Freq: Every day | ORAL | 3 refills | Status: AC
Start: 2022-09-10 — End: ?

## 2022-09-10 MED ORDER — TIRZEPATIDE 2.5 MG/0.5ML ~~LOC~~ SOAJ
2.5000 mg | SUBCUTANEOUS | 3 refills | Status: DC
Start: 2022-09-10 — End: 2022-10-19

## 2022-09-10 MED ORDER — ROSUVASTATIN CALCIUM 10 MG PO TABS
10.0000 mg | ORAL_TABLET | Freq: Every day | ORAL | 3 refills | Status: AC
Start: 2022-09-10 — End: ?

## 2022-09-10 NOTE — Assessment & Plan Note (Signed)
Diet and nutrition discussed Benefits of exercise discussed Advised to decrease amount of daily carbohydrate intake and daily calories and increase amount of plant-based protein in his diet Follow-up in 4 weeks

## 2022-09-10 NOTE — Assessment & Plan Note (Signed)
Uncontrolled hypertension Cardiovascular risks associated with uncontrolled hypertension discussed Benefits of exercise discussed Dietary approaches to stop hypertension discussed Recommend to start valsartan HCT 160-12.5 mg daily Advised to monitor blood pressure readings at home daily and keep a log for the next several weeks.  Advised to contact the office if numbers persistently abnormal.

## 2022-09-10 NOTE — Assessment & Plan Note (Addendum)
Uncontrolled diabetes with hemoglobin A1c at 11.7 Cardiovascular risks associated with uncontrolled diabetes discussed Diet and nutrition discussed Recommend to start Mounjaro 2.5 mg weekly and Jardiance 10 mg daily Recommend to also start rosuvastatin 10 mg daily.  Lipid profile done today. Blood work done today Follow-up in 4 weeks.

## 2022-09-10 NOTE — Patient Instructions (Signed)

## 2022-09-10 NOTE — Progress Notes (Signed)
Jonathon Owens 57 y.o.   Chief Complaint  Patient presents with   New Patient (Initial Visit)    Patient is a diabetic and is concern about his weight gain    HISTORY OF PRESENT ILLNESS: This is a 57 y.o. male first visit to this office, here to establish care with me. History of diabetes.  Presently on no medications.  Concerned about weight gain. Wt Readings from Last 3 Encounters:  09/10/22 277 lb 4 oz (125.8 kg)  06/28/16 290 lb (131.5 kg)  06/25/16 290 lb (131.5 kg)     HPI   Prior to Admission medications   Medication Sig Start Date End Date Taking? Authorizing Provider  cyclobenzaprine (FLEXERIL) 10 MG tablet Take 1 tablet (10 mg total) by mouth 2 (two) times daily as needed for muscle spasms. Patient not taking: Reported on 06/25/2016 05/03/12   Jaynie Crumble, PA-C  insulin glargine (LANTUS) 100 UNIT/ML injection Inject 14 Units into the skin at bedtime. Patient not taking: Reported on 09/10/2022    [provider]  meclizine (ANTIVERT) 25 MG tablet Take 1 tablet (25 mg total) by mouth 3 (three) times daily as needed for dizziness. Patient not taking: Reported on 09/10/2022 06/28/16   Lorre Nick, MD  meloxicam (MOBIC) 15 MG tablet Take 1 tablet (15 mg total) by mouth daily. Patient not taking: Reported on 06/25/2016 05/03/12   Jaynie Crumble, PA-C  oxyCODONE-acetaminophen (PERCOCET) 5-325 MG per tablet Take 1 tablet by mouth every 4 (four) hours as needed for pain. Patient not taking: Reported on 06/25/2016 05/03/12   Jaynie Crumble, PA-C    No Known Allergies  There are no problems to display for this patient.   Past Medical History:  Diagnosis Date   Diabetes mellitus without complication (HCC)    High cholesterol    No pertinent past medical history     Past Surgical History:  Procedure Laterality Date   OPEN REDUCTION INTERNAL FIXATION (ORIF) DISTAL RADIAL FRACTURE  05/09/2012   Procedure: OPEN REDUCTION INTERNAL FIXATION (ORIF) DISTAL  RADIAL FRACTURE;  Surgeon: Jodi Marble, MD;  Location: [facility];  Service: Orthopedics;  Laterality: Right;   PILONIDAL CYST EXCISION  1985   back    Social History   Socioeconomic History   Marital status: Married    Spouse name: Not on file   Number of children: Not on file   Years of education: Not on file   Highest education level: Not on file  Occupational History   Not on file  Tobacco Use   Smoking status: Never   Smokeless tobacco: Never  Substance and Sexual Activity   Alcohol use: No   Drug use: No   Sexual activity: Not on file  Other Topics Concern   Not on file  Social History Narrative   Not on file   Social Determinants of Health   Financial Resource Strain: Not on file  Food Insecurity: Not on file  Transportation Needs: Not on file  Physical Activity: Not on file  Stress: Not on file  Social Connections: Not on file  Intimate Partner Violence: Not on file    No family history on file.   Review of Systems  Constitutional: Negative.  Negative for chills and fever.  HENT: Negative.  Negative for congestion and sore throat.   Respiratory: Negative.  Negative for cough and shortness of breath.   Cardiovascular: Negative.  Negative for chest pain and palpitations.  Gastrointestinal:  Negative for abdominal pain, nausea  and vomiting.  Skin: Negative.  Negative for rash.  Neurological: Negative.  Negative for dizziness and headaches.    Today's Vitals   09/10/22 0901  BP: (!) 158/102  Pulse: 100  Temp: 98.2 F (36.8 C)  TempSrc: Oral  SpO2: 95%  Weight: 277 lb 4 oz (125.8 kg)  Height: 5' 8.5" (1.74 m)   Body mass index is 41.54 kg/m.   Physical Exam Vitals reviewed.  Constitutional:      Appearance: Normal appearance. He is obese.  HENT:     Head: Normocephalic.     Mouth/Throat:     Mouth: Mucous membranes are moist.     Pharynx: Oropharynx is clear.  Eyes:     Extraocular Movements: Extraocular movements  intact.     Pupils: Pupils are equal, round, and reactive to light.  Cardiovascular:     Rate and Rhythm: Normal rate and regular rhythm.     Pulses: Normal pulses.     Heart sounds: Normal heart sounds.  Pulmonary:     Effort: Pulmonary effort is normal.     Breath sounds: Normal breath sounds.  Musculoskeletal:     Cervical back: No tenderness.  Lymphadenopathy:     Cervical: No cervical adenopathy.  Skin:    General: Skin is warm and dry.  Neurological:     Mental Status: He is alert and oriented to person, place, and time.  Psychiatric:        Mood and Affect: Mood normal.        Behavior: Behavior normal.    Results for orders placed or performed in visit on 09/10/22 (from the past 24 hour(s))  POCT glycosylated hemoglobin (Hb A1C)     Status: Abnormal   Collection Time: 09/10/22  9:45 AM  Result Value Ref Range   Hemoglobin A1C 11.7 (A) 4.0 - 5.6 %   HbA1c POC (<> result, manual entry)     HbA1c, POC (prediabetic range)     HbA1c, POC (controlled diabetic range)       ASSESSMENT & PLAN: A total of 49 minutes was spent with the patient and counseling/coordination of care regarding preparing for this visit, review of available medical records, establishing care with me, diagnosis of uncontrolled diabetes and hypertension, cardiovascular risks associated with uncontrolled diabetes and hypertension, education on nutrition, interpretation of today's hemoglobin A1c, starting new diabetes and hypertension medications, need for blood work today, prognosis, documentation and need for follow-up in 4 weeks . Problem List Items Addressed This Visit       Cardiovascular and Mediastinum   Uncontrolled hypertension    Uncontrolled hypertension Cardiovascular risks associated with uncontrolled hypertension discussed Benefits of exercise discussed Dietary approaches to stop hypertension discussed Recommend to start valsartan HCT 160-12.5 mg daily Advised to monitor blood pressure  readings at home daily and keep a log for the next several weeks.  Advised to contact the office if numbers persistently abnormal.      Relevant Medications   valsartan-hydrochlorothiazide (DIOVAN-HCT) 160-12.5 MG tablet   rosuvastatin (CRESTOR) 10 MG tablet     Endocrine   Uncontrolled type 2 diabetes mellitus with hyperglycemia (HCC) - Primary    Uncontrolled diabetes with hemoglobin A1c at 11.7 Cardiovascular risks associated with uncontrolled diabetes discussed Diet and nutrition discussed Recommend to start Mounjaro 2.5 mg weekly and Jardiance 10 mg daily Recommend to also start rosuvastatin 10 mg daily.  Lipid profile done today. Blood work done today Follow-up in 4 weeks.      Relevant Medications  tirzepatide [facility]) 2.5 MG/0.5ML Pen   empagliflozin (JARDIANCE) 10 MG TABS tablet   valsartan-hydrochlorothiazide (DIOVAN-HCT) 160-12.5 MG tablet   rosuvastatin (CRESTOR) 10 MG tablet   Other Relevant Orders   POCT glycosylated hemoglobin (Hb A1C) (Completed)   CBC with Differential/Platelet   Comprehensive metabolic panel   Lipid panel   Urine Microalbumin w/creat. ratio     Other   Morbid obesity (HCC)    Diet and nutrition discussed Benefits of exercise discussed Advised to decrease amount of daily carbohydrate intake and daily calories and increase amount of plant-based protein in his diet Follow-up in 4 weeks      Relevant Medications   tirzepatide (MOUNJARO) 2.5 MG/0.5ML Pen   empagliflozin (JARDIANCE) 10 MG TABS tablet   Other Relevant Orders   POCT glycosylated hemoglobin (Hb A1C) (Completed)   CBC with Differential/Platelet   Comprehensive metabolic panel   Lipid panel   Urine Microalbumin w/creat. ratio   Other Visit Diagnoses     Encounter to establish care       Screening for colon cancer       Relevant Orders   Ambulatory referral to Gastroenterology   Need for vaccination            Patient Instructions  Diabetes Mellitus and Nutrition,  Adult When you have diabetes, or diabetes mellitus, it is very important to have healthy eating habits because your blood sugar (glucose) levels are greatly affected by what you eat and drink. Eating healthy foods in the right amounts, at about the same times every day, can help you: Manage your blood glucose. Lower your risk of heart disease. Improve your blood pressure. Reach or maintain a healthy weight. What can affect my meal plan? Every person with diabetes is different, and each person has different needs for a meal plan. Your health care provider may recommend that you work with a dietitian to make a meal plan that is best for you. Your meal plan may vary depending on factors such as: The calories you need. The medicines you take. Your weight. Your blood glucose, blood pressure, and cholesterol levels. Your activity level. Other health conditions you have, such as heart or kidney disease. How do carbohydrates affect me? Carbohydrates, also called carbs, affect your blood glucose level more than any other type of food. Eating carbs raises the amount of glucose in your blood. It is important to know how many carbs you can safely have in each meal. This is different for every person. Your dietitian can help you calculate how many carbs you should have at each meal and for each snack. How does alcohol affect me? Alcohol can cause a decrease in blood glucose (hypoglycemia), especially if you use insulin or take certain diabetes medicines by mouth. Hypoglycemia can be a life-threatening condition. Symptoms of hypoglycemia, such as sleepiness, dizziness, and confusion, are similar to symptoms of having too much alcohol. Do not drink alcohol if: Your health care provider tells you not to drink. You are pregnant, may be pregnant, or are planning to become pregnant. If you drink alcohol: Limit how much you have to: 0-1 drink a day for women. 0-2 drinks a day for men. Know how much alcohol is  in your drink. In the [place] one drink equals one 12 oz bottle of beer (355 mL), one 5 oz glass of wine (148 mL), or one 1 oz glass of hard liquor (44 mL). Keep yourself hydrated with water, diet soda, or unsweetened iced tea. Keep in mind  that regular soda, juice, and other mixers may contain a lot of sugar and must be counted as carbs. What are tips for following this plan?  Reading food labels Start by checking the serving size on the Nutrition Facts label of packaged foods and drinks. The number of calories and the amount of carbs, fats, and other nutrients listed on the label are based on one serving of the item. Many items contain more than one serving per package. Check the total grams (g) of carbs in one serving. Check the number of grams of saturated fats and trans fats in one serving. Choose foods that have a low amount or none of these fats. Check the number of milligrams (mg) of salt (sodium) in one serving. Most people should limit total sodium intake to less than 2,300 mg per day. Always check the nutrition information of foods labeled as "low-fat" or "nonfat." These foods may be higher in added sugar or refined carbs and should be avoided. Talk to your dietitian to identify your daily goals for nutrients listed on the label. Shopping Avoid buying canned, pre-made, or processed foods. These foods tend to be high in fat, sodium, and added sugar. Shop around the outside edge of the grocery store. This is where you will most often find fresh fruits and vegetables, bulk grains, fresh meats, and fresh dairy products. Cooking Use low-heat cooking methods, such as baking, instead of high-heat cooking methods, such as deep frying. Cook using healthy oils, such as olive, canola, or sunflower oil. Avoid cooking with butter, cream, or high-fat meats. Meal planning Eat meals and snacks regularly, preferably at the same times every day. Avoid going long periods of time without eating. Eat foods  that are high in fiber, such as fresh fruits, vegetables, beans, and whole grains. Eat 4-6 oz (112-168 g) of lean protein each day, such as lean meat, chicken, fish, eggs, or tofu. One ounce (oz) (28 g) of lean protein is equal to: 1 oz (28 g) of meat, chicken, or fish. 1 egg.  cup (62 g) of tofu. Eat some foods each day that contain healthy fats, such as avocado, nuts, seeds, and fish. What foods should I eat? Fruits Berries. Apples. Oranges. Peaches. Apricots. Plums. Grapes. Mangoes. Papayas. Pomegranates. Kiwi. Cherries. Vegetables Leafy greens, including lettuce, spinach, kale, chard, collard greens, mustard greens, and cabbage. Beets. Cauliflower. Broccoli. Carrots. Green beans. Tomatoes. Peppers. Onions. Cucumbers. Brussels sprouts. Grains Whole grains, such as whole-wheat or whole-grain bread, crackers, tortillas, cereal, and pasta. Unsweetened oatmeal. Quinoa. Brown or wild rice. Meats and other proteins Seafood. Poultry without skin. Lean cuts of poultry and beef. Tofu. Nuts. Seeds. Dairy Low-fat or fat-free dairy products such as milk, yogurt, and cheese. The items listed above may not be a complete list of foods and beverages you can eat and drink. Contact a dietitian for more information. What foods should I avoid? Fruits Fruits canned with syrup. Vegetables Canned vegetables. Frozen vegetables with butter or cream sauce. Grains Refined white flour and flour products such as bread, pasta, snack foods, and cereals. Avoid all processed foods. Meats and other proteins Fatty cuts of meat. Poultry with skin. Breaded or fried meats. Processed meat. Avoid saturated fats. Dairy Full-fat yogurt, cheese, or milk. Beverages Sweetened drinks, such as soda or iced tea. The items listed above may not be a complete list of foods and beverages you should avoid. Contact a dietitian for more information. Questions to ask a health care provider Do I need to meet with  a certified diabetes  care and education specialist? Do I need to meet with a dietitian? What number can I call if I have questions? When are the best times to check my blood glucose? Where to find more information: American Diabetes Association: diabetes.org Academy of Nutrition and Dietetics: eatright.[employer] of Diabetes and Digestive and Kidney Diseases: StageSync.si Association of Diabetes Care & Education Specialists: diabeteseducator.org Summary It is important to have healthy eating habits because your blood sugar (glucose) levels are greatly affected by what you eat and drink. It is important to use alcohol carefully. A healthy meal plan will help you manage your blood glucose and lower your risk of heart disease. Your health care provider may recommend that you work with a dietitian to make a meal plan that is best for you. This information is not intended to replace advice given to you by your health care provider. Make sure you discuss any questions you have with your health care provider. Document Revised: 10/25/2019 Document Reviewed: 10/25/2019 Elsevier Patient Education  2024 Elsevier Inc.     Edwina Barth, MD [facility]

## 2022-10-19 ENCOUNTER — Ambulatory Visit: Payer: BC Managed Care – PPO | Admitting: Emergency Medicine

## 2022-10-19 ENCOUNTER — Encounter: Payer: Self-pay | Admitting: Emergency Medicine

## 2022-10-19 VITALS — BP 140/82 | HR 87 | Temp 98.4°F | Ht 70.2 in | Wt 271.1 lb

## 2022-10-19 DIAGNOSIS — I10 Essential (primary) hypertension: Secondary | ICD-10-CM | POA: Diagnosis not present

## 2022-10-19 DIAGNOSIS — E1165 Type 2 diabetes mellitus with hyperglycemia: Secondary | ICD-10-CM | POA: Diagnosis not present

## 2022-10-19 DIAGNOSIS — Z23 Encounter for immunization: Secondary | ICD-10-CM

## 2022-10-19 MED ORDER — TIRZEPATIDE 5 MG/0.5ML ~~LOC~~ SOPN
5.0000 mg | PEN_INJECTOR | SUBCUTANEOUS | 3 refills | Status: AC
Start: 2022-10-19 — End: ?

## 2022-10-19 MED ORDER — AMLODIPINE BESYLATE 5 MG PO TABS
5.0000 mg | ORAL_TABLET | Freq: Every day | ORAL | 3 refills | Status: AC
Start: 2022-10-19 — End: ?

## 2022-10-19 NOTE — Progress Notes (Signed)
Jonathon Owens 57 y.o.   Chief Complaint  Patient presents with   Medical Management of Chronic Issues    4 week f/u appt, patient states his BP has still been running high     HISTORY OF PRESENT ILLNESS: This is a 57 y.o. male here for 4-week follow-up of uncontrolled diabetes and uncontrolled hypertension Taking medications as prescribed. Blood pressure readings at home on the high side still. No other complaints or medical concerns today. Wt Readings from Last 3 Encounters:  10/19/22 271 lb 2 oz (123 kg)  09/10/22 277 lb 4 oz (125.8 kg)  06/28/16 290 lb (131.5 kg)   BP Readings from Last 3 Encounters:  10/19/22 (!) 140/82  09/10/22 (!) 160/100  06/28/16 131/82   Lab Results  Component Value Date   HGBA1C 11.7 (A) 09/10/2022     HPI   Prior to Admission medications   Medication Sig Start Date End Date Taking? Authorizing Provider  empagliflozin (JARDIANCE) 10 MG TABS tablet Take 1 tablet (10 mg total) by mouth daily before breakfast. 09/10/22  Yes Dalvin Clipper, Eilleen Kempf, MD  rosuvastatin (CRESTOR) 10 MG tablet Take 1 tablet (10 mg total) by mouth daily. 09/10/22  Yes Adelie Croswell, Eilleen Kempf, MD  tirzepatide [facility]) 2.5 MG/0.5ML Pen Inject 2.5 mg into the skin once a week. 09/10/22  Yes Yarelin Reichardt, Eilleen Kempf, MD  valsartan-hydrochlorothiazide (DIOVAN-HCT) 160-12.5 MG tablet Take 1 tablet by mouth daily. 09/10/22  Yes Georgina Quint, MD    No Known Allergies  Patient Active Problem List   Diagnosis Date Noted   Uncontrolled type 2 diabetes mellitus with hyperglycemia (HCC) 09/10/2022   Morbid obesity (HCC) 09/10/2022   Uncontrolled hypertension 09/10/2022    Past Medical History:  Diagnosis Date   Diabetes mellitus without complication (HCC)    High cholesterol    No pertinent past medical history     Past Surgical History:  Procedure Laterality Date   OPEN REDUCTION INTERNAL FIXATION (ORIF) DISTAL RADIAL FRACTURE  05/09/2012   Procedure: OPEN REDUCTION  INTERNAL FIXATION (ORIF) DISTAL RADIAL FRACTURE;  Surgeon: Jodi Marble, MD;  Location: [facility];  Service: Orthopedics;  Laterality: Right;   PILONIDAL CYST EXCISION  1985   back    Social History   Socioeconomic History   Marital status: Married    Spouse name: Not on file   Number of children: Not on file   Years of education: Not on file   Highest education level: Not on file  Occupational History   Not on file  Tobacco Use   Smoking status: Never   Smokeless tobacco: Never  Substance and Sexual Activity   Alcohol use: No   Drug use: No   Sexual activity: Not on file  Other Topics Concern   Not on file  Social History Narrative   Not on file   Social Determinants of Health   Financial Resource Strain: Not on file  Food Insecurity: Not on file  Transportation Needs: Not on file  Physical Activity: Not on file  Stress: Not on file  Social Connections: Not on file  Intimate Partner Violence: Not on file    No family history on file.   Review of Systems  Constitutional: Negative.  Negative for chills and fever.  HENT: Negative.  Negative for congestion and sore throat.   Respiratory: Negative.  Negative for cough and shortness of breath.   Cardiovascular: Negative.  Negative for chest pain and palpitations.  Gastrointestinal:  Negative for abdominal  pain, nausea and vomiting.  Skin: Negative.  Negative for rash.  Neurological: Negative.  Negative for dizziness and headaches.  All other systems reviewed and are negative.   Vitals:   10/19/22 0834  BP: (!) 140/82  Pulse: 87  Temp: 98.4 F (36.9 C)  SpO2: 96%    Physical Exam Vitals reviewed.  Constitutional:      Appearance: Normal appearance.  HENT:     Head: Normocephalic.  Eyes:     Extraocular Movements: Extraocular movements intact.  Cardiovascular:     Rate and Rhythm: Normal rate and regular rhythm.     Pulses: Normal pulses.     Heart sounds: Normal heart sounds.   Pulmonary:     Effort: Pulmonary effort is normal.     Breath sounds: Normal breath sounds.  Skin:    General: Skin is warm and dry.     Capillary Refill: Capillary refill takes less than 2 seconds.  Neurological:     General: No focal deficit present.     Mental Status: He is alert and oriented to person, place, and time.  Psychiatric:        Mood and Affect: Mood normal.        Behavior: Behavior normal.      ASSESSMENT & PLAN: A total of 44 minutes was spent with the patient and counseling/coordination of care regarding preparing for this visit, review of most recent office visit notes, review of chronic medical conditions under management, review of all medications and changes made, review of most recent blood work results, cardiovascular risks associated with uncontrolled hypertension and diabetes, education on nutrition, prognosis, documentation, and need for follow-up  Problem List Items Addressed This Visit       Cardiovascular and Mediastinum   Uncontrolled hypertension - Primary    Blood pressure readings still not at goal Continue valsartan HCT 160-12.5 mg Recommend to start amlodipine 5 mg daily Continue monitoring blood pressure readings at home daily for the next several weeks and keep a log.  Advised to contact the office if numbers persistently abnormal Benefits of exercise discussed Follow-up in 2 months      Relevant Medications   amLODipine (NORVASC) 5 MG tablet     Endocrine   Uncontrolled type 2 diabetes mellitus with hyperglycemia (HCC)    Cardiovascular risks associated with uncontrolled diabetes discussed Compliant with medications Tolerating Mounjaro well.  Recommend to increase dose to 5 mg weekly Continue Jardiance 10 mg daily Continue rosuvastatin 10 mg daily Diet and nutrition discussed Follow-up in 2 months      Relevant Medications   tirzepatide [facility]) 5 MG/0.5ML Pen     Other   Morbid obesity (HCC)    Diet and nutrition  discussed Benefits of exercise discussed Advised to decrease amount of daily carbohydrate intake and daily calories and increase amount of plant-based protein in his diet. Wt Readings from Last 3 Encounters:  10/19/22 271 lb 2 oz (123 kg)  09/10/22 277 lb 4 oz (125.8 kg)  06/28/16 290 lb (131.5 kg)         Relevant Medications   tirzepatide (MOUNJARO) 5 MG/0.5ML Pen   Other Visit Diagnoses     Need for shingles vaccine       Relevant Orders   Zoster, Recombinant (Shingrix)      Patient Instructions  Diabetes Mellitus and Nutrition, Adult When you have diabetes, or diabetes mellitus, it is very important to have healthy eating habits because your blood sugar (glucose) levels are greatly  affected by what you eat and drink. Eating healthy foods in the right amounts, at about the same times every day, can help you: Manage your blood glucose. Lower your risk of heart disease. Improve your blood pressure. Reach or maintain a healthy weight. What can affect my meal plan? Every person with diabetes is different, and each person has different needs for a meal plan. Your health care provider may recommend that you work with a dietitian to make a meal plan that is best for you. Your meal plan may vary depending on factors such as: The calories you need. The medicines you take. Your weight. Your blood glucose, blood pressure, and cholesterol levels. Your activity level. Other health conditions you have, such as heart or kidney disease. How do carbohydrates affect me? Carbohydrates, also called carbs, affect your blood glucose level more than any other type of food. Eating carbs raises the amount of glucose in your blood. It is important to know how many carbs you can safely have in each meal. This is different for every person. Your dietitian can help you calculate how many carbs you should have at each meal and for each snack. How does alcohol affect me? Alcohol can cause a decrease in  blood glucose (hypoglycemia), especially if you use insulin or take certain diabetes medicines by mouth. Hypoglycemia can be a life-threatening condition. Symptoms of hypoglycemia, such as sleepiness, dizziness, and confusion, are similar to symptoms of having too much alcohol. Do not drink alcohol if: Your health care provider tells you not to drink. You are pregnant, may be pregnant, or are planning to become pregnant. If you drink alcohol: Limit how much you have to: 0-1 drink a day for women. 0-2 drinks a day for men. Know how much alcohol is in your drink. In the [place] one drink equals one 12 oz bottle of beer (355 mL), one 5 oz glass of wine (148 mL), or one 1 oz glass of hard liquor (44 mL). Keep yourself hydrated with water, diet soda, or unsweetened iced tea. Keep in mind that regular soda, juice, and other mixers may contain a lot of sugar and must be counted as carbs. What are tips for following this plan?  Reading food labels Start by checking the serving size on the Nutrition Facts label of packaged foods and drinks. The number of calories and the amount of carbs, fats, and other nutrients listed on the label are based on one serving of the item. Many items contain more than one serving per package. Check the total grams (g) of carbs in one serving. Check the number of grams of saturated fats and trans fats in one serving. Choose foods that have a low amount or none of these fats. Check the number of milligrams (mg) of salt (sodium) in one serving. Most people should limit total sodium intake to less than 2,300 mg per day. Always check the nutrition information of foods labeled as "low-fat" or "nonfat." These foods may be higher in added sugar or refined carbs and should be avoided. Talk to your dietitian to identify your daily goals for nutrients listed on the label. Shopping Avoid buying canned, pre-made, or processed foods. These foods tend to be high in fat, sodium, and added  sugar. Shop around the outside edge of the grocery store. This is where you will most often find fresh fruits and vegetables, bulk grains, fresh meats, and fresh dairy products. Cooking Use low-heat cooking methods, such as baking, instead of high-heat cooking  methods, such as deep frying. Cook using healthy oils, such as olive, canola, or sunflower oil. Avoid cooking with butter, cream, or high-fat meats. Meal planning Eat meals and snacks regularly, preferably at the same times every day. Avoid going long periods of time without eating. Eat foods that are high in fiber, such as fresh fruits, vegetables, beans, and whole grains. Eat 4-6 oz (112-168 g) of lean protein each day, such as lean meat, chicken, fish, eggs, or tofu. One ounce (oz) (28 g) of lean protein is equal to: 1 oz (28 g) of meat, chicken, or fish. 1 egg.  cup (62 g) of tofu. Eat some foods each day that contain healthy fats, such as avocado, nuts, seeds, and fish. What foods should I eat? Fruits Berries. Apples. Oranges. Peaches. Apricots. Plums. Grapes. Mangoes. Papayas. Pomegranates. Kiwi. Cherries. Vegetables Leafy greens, including lettuce, spinach, kale, chard, collard greens, mustard greens, and cabbage. Beets. Cauliflower. Broccoli. Carrots. Green beans. Tomatoes. Peppers. Onions. Cucumbers. Brussels sprouts. Grains Whole grains, such as whole-wheat or whole-grain bread, crackers, tortillas, cereal, and pasta. Unsweetened oatmeal. Quinoa. Brown or wild rice. Meats and other proteins Seafood. Poultry without skin. Lean cuts of poultry and beef. Tofu. Nuts. Seeds. Dairy Low-fat or fat-free dairy products such as milk, yogurt, and cheese. The items listed above may not be a complete list of foods and beverages you can eat and drink. Contact a dietitian for more information. What foods should I avoid? Fruits Fruits canned with syrup. Vegetables Canned vegetables. Frozen vegetables with butter or cream  sauce. Grains Refined white flour and flour products such as bread, pasta, snack foods, and cereals. Avoid all processed foods. Meats and other proteins Fatty cuts of meat. Poultry with skin. Breaded or fried meats. Processed meat. Avoid saturated fats. Dairy Full-fat yogurt, cheese, or milk. Beverages Sweetened drinks, such as soda or iced tea. The items listed above may not be a complete list of foods and beverages you should avoid. Contact a dietitian for more information. Questions to ask a health care provider Do I need to meet with a certified diabetes care and education specialist? Do I need to meet with a dietitian? What number can I call if I have questions? When are the best times to check my blood glucose? Where to find more information: American Diabetes Association: diabetes.org Academy of Nutrition and Dietetics: eatright[employer] of Diabetes and Digestive and Kidney Diseases: StageSync.si Association of Diabetes Care & Education Specialists: diabeteseducator.org Summary It is important to have healthy eating habits because your blood sugar (glucose) levels are greatly affected by what you eat and drink. It is important to use alcohol carefully. A healthy meal plan will help you manage your blood glucose and lower your risk of heart disease. Your health care provider may recommend that you work with a dietitian to make a meal plan that is best for you. This information is not intended to replace advice given to you by your health care provider. Make sure you discuss any questions you have with your health care provider. Document Revised: 10/25/2019 Document Reviewed: 10/25/2019 Elsevier Patient Education  2024 Elsevier Inc.      Edwina Barth, MD [facility]

## 2022-10-19 NOTE — Assessment & Plan Note (Signed)
Cardiovascular risks associated with uncontrolled diabetes discussed Compliant with medications Tolerating Mounjaro well.  Recommend to increase dose to 5 mg weekly Continue Jardiance 10 mg daily Continue rosuvastatin 10 mg daily Diet and nutrition discussed Follow-up in 2 months

## 2022-10-19 NOTE — Assessment & Plan Note (Signed)
Diet and nutrition discussed Benefits of exercise discussed Advised to decrease amount of daily carbohydrate intake and daily calories and increase amount of plant-based protein in his diet. Wt Readings from Last 3 Encounters:  10/19/22 271 lb 2 oz (123 kg)  09/10/22 277 lb 4 oz (125.8 kg)  06/28/16 290 lb (131.5 kg)

## 2022-10-19 NOTE — Assessment & Plan Note (Signed)
Blood pressure readings still not at goal Continue valsartan HCT 160-12.5 mg Recommend to start amlodipine 5 mg daily Continue monitoring blood pressure readings at home daily for the next several weeks and keep a log.  Advised to contact the office if numbers persistently abnormal Benefits of exercise discussed Follow-up in 2 months

## 2022-10-19 NOTE — Patient Instructions (Signed)

## 2022-11-19 ENCOUNTER — Encounter (INDEPENDENT_AMBULATORY_CARE_PROVIDER_SITE_OTHER): Payer: Self-pay

## 2022-12-23 ENCOUNTER — Encounter: Payer: Self-pay | Admitting: Pharmacist

## 2022-12-31 ENCOUNTER — Ambulatory Visit: Payer: BC Managed Care – PPO | Admitting: Emergency Medicine

## 2022-12-31 ENCOUNTER — Encounter: Payer: Self-pay | Admitting: Emergency Medicine

## 2022-12-31 VITALS — BP 104/86 | HR 93 | Temp 98.4°F | Ht 70.2 in | Wt 267.4 lb

## 2022-12-31 DIAGNOSIS — Z1211 Encounter for screening for malignant neoplasm of colon: Secondary | ICD-10-CM | POA: Diagnosis not present

## 2022-12-31 DIAGNOSIS — N529 Male erectile dysfunction, unspecified: Secondary | ICD-10-CM

## 2022-12-31 DIAGNOSIS — I152 Hypertension secondary to endocrine disorders: Secondary | ICD-10-CM | POA: Insufficient documentation

## 2022-12-31 DIAGNOSIS — Z7984 Long term (current) use of oral hypoglycemic drugs: Secondary | ICD-10-CM

## 2022-12-31 DIAGNOSIS — E1159 Type 2 diabetes mellitus with other circulatory complications: Secondary | ICD-10-CM | POA: Diagnosis not present

## 2022-12-31 DIAGNOSIS — Z23 Encounter for immunization: Secondary | ICD-10-CM | POA: Diagnosis not present

## 2022-12-31 LAB — POCT GLYCOSYLATED HEMOGLOBIN (HGB A1C): Hemoglobin A1C: 7.4 % — AB (ref 4.0–5.6)

## 2022-12-31 MED ORDER — SILDENAFIL CITRATE 100 MG PO TABS
50.0000 mg | ORAL_TABLET | Freq: Every day | ORAL | 11 refills | Status: AC | PRN
Start: 2022-12-31 — End: ?

## 2022-12-31 NOTE — Assessment & Plan Note (Signed)
BP Readings from Last 3 Encounters:  12/31/22 104/86  10/19/22 (!) 140/82  09/10/22 (!) 160/100  Well-controlled hypertension Continue amlodipine 5 mg and valsartan HCT 160-12.5 mg daily Diabetes much improved with hemoglobin A1c down to 7.4 Cardiovascular risks associated with hypertension and diabetes discussed Diet and nutrition discussed Continue Jardiance 10 mg daily and weekly Mounjaro 5 mg Follow-up in 3 months

## 2022-12-31 NOTE — Progress Notes (Signed)
Rosalin Hawking 57 y.o.   Chief Complaint  Patient presents with   Medical Management of Chronic Issues    f/u appt, no concerns     HISTORY OF PRESENT ILLNESS: This is a 57 y.o. male here for follow-up of uncontrolled diabetes and hypertension Overall doing better.  Compliant with medications. BP Readings from Last 3 Encounters:  12/31/22 104/86  10/19/22 (!) 140/82  09/10/22 (!) 160/100   Lab Results  Component Value Date   HGBA1C 11.7 (A) 09/10/2022   Wt Readings from Last 3 Encounters:  12/31/22 267 lb 6 oz (121.3 kg)  10/19/22 271 lb 2 oz (123 kg)  09/10/22 277 lb 4 oz (125.8 kg)     HPI   Prior to Admission medications   Medication Sig Start Date End Date Taking? Authorizing Provider  amLODipine (NORVASC) 5 MG tablet Take 1 tablet (5 mg total) by mouth daily. 10/19/22  Yes Myra Weng, Eilleen Kempf, MD  empagliflozin (JARDIANCE) 10 MG TABS tablet Take 1 tablet (10 mg total) by mouth daily before breakfast. 09/10/22  Yes Joely Losier, Eilleen Kempf, MD  rosuvastatin (CRESTOR) 10 MG tablet Take 1 tablet (10 mg total) by mouth daily. 09/10/22  Yes Sims Laday, Eilleen Kempf, MD  tirzepatide [facility]) 5 MG/0.5ML Pen Inject 5 mg into the skin once a week. 10/19/22  Yes Bonni Neuser, Eilleen Kempf, MD  valsartan-hydrochlorothiazide (DIOVAN-HCT) 160-12.5 MG tablet Take 1 tablet by mouth daily. 09/10/22  Yes Georgina Quint, MD    No Known Allergies  Patient Active Problem List   Diagnosis Date Noted   Uncontrolled type 2 diabetes mellitus with hyperglycemia (HCC) 09/10/2022   Morbid obesity (HCC) 09/10/2022   Uncontrolled hypertension 09/10/2022    Past Medical History:  Diagnosis Date   Diabetes mellitus without complication (HCC)    High cholesterol    No pertinent past medical history     Past Surgical History:  Procedure Laterality Date   OPEN REDUCTION INTERNAL FIXATION (ORIF) DISTAL RADIAL FRACTURE  05/09/2012   Procedure: OPEN REDUCTION INTERNAL FIXATION (ORIF) DISTAL  RADIAL FRACTURE;  Surgeon: Jodi Marble, MD;  Location: [facility];  Service: Orthopedics;  Laterality: Right;   PILONIDAL CYST EXCISION  1985   back    Social History   Socioeconomic History   Marital status: Married    Spouse name: Not on file   Number of children: Not on file   Years of education: Not on file   Highest education level: Not on file  Occupational History   Not on file  Tobacco Use   Smoking status: Never   Smokeless tobacco: Never  Substance and Sexual Activity   Alcohol use: No   Drug use: No   Sexual activity: Not on file  Other Topics Concern   Not on file  Social History Narrative   Not on file   Social Determinants of Health   Financial Resource Strain: Not on file  Food Insecurity: Not on file  Transportation Needs: Not on file  Physical Activity: Not on file  Stress: Not on file  Social Connections: Not on file  Intimate Partner Violence: Not on file    No family history on file.   Review of Systems  Constitutional: Negative.  Negative for chills and fever.  HENT: Negative.  Negative for congestion and sore throat.   Respiratory: Negative.  Negative for cough and shortness of breath.   Cardiovascular: Negative.  Negative for chest pain and palpitations.  Gastrointestinal:  Negative for abdominal  pain, diarrhea, nausea and vomiting.  Genitourinary: Negative.  Negative for dysuria and hematuria.  Skin: Negative.  Negative for rash.  Neurological: Negative.  Negative for dizziness and headaches.  All other systems reviewed and are negative.   Vitals:   12/31/22 0845  BP: 104/86  Pulse: 93  Temp: 98.4 F (36.9 C)  SpO2: 96%    Physical Exam Vitals reviewed.  Constitutional:      Appearance: Normal appearance.  HENT:     Head: Normocephalic.  Eyes:     Extraocular Movements: Extraocular movements intact.     Pupils: Pupils are equal, round, and reactive to light.  Cardiovascular:     Rate and Rhythm: Normal  rate and regular rhythm.     Pulses: Normal pulses.     Heart sounds: Normal heart sounds.  Pulmonary:     Effort: Pulmonary effort is normal.     Breath sounds: Normal breath sounds.  Abdominal:     Palpations: Abdomen is soft.     Tenderness: There is no abdominal tenderness.  Musculoskeletal:     Cervical back: No tenderness.  Lymphadenopathy:     Cervical: No cervical adenopathy.  Skin:    General: Skin is warm and dry.     Capillary Refill: Capillary refill takes less than 2 seconds.  Neurological:     General: No focal deficit present.     Mental Status: He is alert and oriented to person, place, and time.  Psychiatric:        Mood and Affect: Mood normal.        Behavior: Behavior normal.    Results for orders placed or performed in visit on 12/31/22 (from the past 24 hour(s))  POCT HgB A1C     Status: Abnormal   Collection Time: 12/31/22  9:12 AM  Result Value Ref Range   Hemoglobin A1C 7.4 (A) 4.0 - 5.6 %   HbA1c POC (<> result, manual entry)     HbA1c, POC (prediabetic range)     HbA1c, POC (controlled diabetic range)       ASSESSMENT & PLAN: A total of 45 minutes was spent with the patient and counseling/coordination of care regarding preparing for this visit, review of most recent office visit notes, review of multiple chronic medical conditions and their management, review of most recent blood work results including interpretation of today's hemoglobin A1c, review of all medications, cardiovascular risks associated with hypertension and diabetes, education on nutrition, review of health maintenance items, prognosis, documentation, and need for follow-up.  Problem List Items Addressed This Visit       Cardiovascular and Mediastinum   Hypertension associated with diabetes (HCC) - Primary    BP Readings from Last 3 Encounters:  12/31/22 104/86  10/19/22 (!) 140/82  09/10/22 (!) 160/100  Well-controlled hypertension Continue amlodipine 5 mg and valsartan HCT  160-12.5 mg daily Diabetes much improved with hemoglobin A1c down to 7.4 Cardiovascular risks associated with hypertension and diabetes discussed Diet and nutrition discussed Continue Jardiance 10 mg daily and weekly Mounjaro 5 mg Follow-up in 3 months       Relevant Medications   sildenafil (VIAGRA) 100 MG tablet   Other Relevant Orders   POCT HgB A1C (Completed)   Other Visit Diagnoses     Need for shingles vaccine       Relevant Orders   Zoster, Recombinant (Shingrix) (Completed)   Erectile dysfunction, unspecified erectile dysfunction type       Relevant Medications   sildenafil (VIAGRA)  100 MG tablet   Other Relevant Orders   Testosterone   Need for vaccination       Relevant Orders   Zoster, Recombinant (Shingrix) (Completed)   Screening for colon cancer       Relevant Orders   Cologuard        Patient Instructions  Diabetes Mellitus and Nutrition, Adult When you have diabetes, or diabetes mellitus, it is very important to have healthy eating habits because your blood sugar (glucose) levels are greatly affected by what you eat and drink. Eating healthy foods in the right amounts, at about the same times every day, can help you: Manage your blood glucose. Lower your risk of heart disease. Improve your blood pressure. Reach or maintain a healthy weight. What can affect my meal plan? Every person with diabetes is different, and each person has different needs for a meal plan. Your health care provider may recommend that you work with a dietitian to make a meal plan that is best for you. Your meal plan may vary depending on factors such as: The calories you need. The medicines you take. Your weight. Your blood glucose, blood pressure, and cholesterol levels. Your activity level. Other health conditions you have, such as heart or kidney disease. How do carbohydrates affect me? Carbohydrates, also called carbs, affect your blood glucose level more than any other  type of food. Eating carbs raises the amount of glucose in your blood. It is important to know how many carbs you can safely have in each meal. This is different for every person. Your dietitian can help you calculate how many carbs you should have at each meal and for each snack. How does alcohol affect me? Alcohol can cause a decrease in blood glucose (hypoglycemia), especially if you use insulin or take certain diabetes medicines by mouth. Hypoglycemia can be a life-threatening condition. Symptoms of hypoglycemia, such as sleepiness, dizziness, and confusion, are similar to symptoms of having too much alcohol. Do not drink alcohol if: Your health care provider tells you not to drink. You are pregnant, may be pregnant, or are planning to become pregnant. If you drink alcohol: Limit how much you have to: 0-1 drink a day for women. 0-2 drinks a day for men. Know how much alcohol is in your drink. In the [place] one drink equals one 12 oz bottle of beer (355 mL), one 5 oz glass of wine (148 mL), or one 1 oz glass of hard liquor (44 mL). Keep yourself hydrated with water, diet soda, or unsweetened iced tea. Keep in mind that regular soda, juice, and other mixers may contain a lot of sugar and must be counted as carbs. What are tips for following this plan?  Reading food labels Start by checking the serving size on the Nutrition Facts label of packaged foods and drinks. The number of calories and the amount of carbs, fats, and other nutrients listed on the label are based on one serving of the item. Many items contain more than one serving per package. Check the total grams (g) of carbs in one serving. Check the number of grams of saturated fats and trans fats in one serving. Choose foods that have a low amount or none of these fats. Check the number of milligrams (mg) of salt (sodium) in one serving. Most people should limit total sodium intake to less than 2,300 mg per day. Always check the  nutrition information of foods labeled as "low-fat" or "nonfat." These foods may be higher  in added sugar or refined carbs and should be avoided. Talk to your dietitian to identify your daily goals for nutrients listed on the label. Shopping Avoid buying canned, pre-made, or processed foods. These foods tend to be high in fat, sodium, and added sugar. Shop around the outside edge of the grocery store. This is where you will most often find fresh fruits and vegetables, bulk grains, fresh meats, and fresh dairy products. Cooking Use low-heat cooking methods, such as baking, instead of high-heat cooking methods, such as deep frying. Cook using healthy oils, such as olive, canola, or sunflower oil. Avoid cooking with butter, cream, or high-fat meats. Meal planning Eat meals and snacks regularly, preferably at the same times every day. Avoid going long periods of time without eating. Eat foods that are high in fiber, such as fresh fruits, vegetables, beans, and whole grains. Eat 4-6 oz (112-168 g) of lean protein each day, such as lean meat, chicken, fish, eggs, or tofu. One ounce (oz) (28 g) of lean protein is equal to: 1 oz (28 g) of meat, chicken, or fish. 1 egg.  cup (62 g) of tofu. Eat some foods each day that contain healthy fats, such as avocado, nuts, seeds, and fish. What foods should I eat? Fruits Berries. Apples. Oranges. Peaches. Apricots. Plums. Grapes. Mangoes. Papayas. Pomegranates. Kiwi. Cherries. Vegetables Leafy greens, including lettuce, spinach, kale, chard, collard greens, mustard greens, and cabbage. Beets. Cauliflower. Broccoli. Carrots. Green beans. Tomatoes. Peppers. Onions. Cucumbers. Brussels sprouts. Grains Whole grains, such as whole-wheat or whole-grain bread, crackers, tortillas, cereal, and pasta. Unsweetened oatmeal. Quinoa. Brown or wild rice. Meats and other proteins Seafood. Poultry without skin. Lean cuts of poultry and beef. Tofu. Nuts.  Seeds. Dairy Low-fat or fat-free dairy products such as milk, yogurt, and cheese. The items listed above may not be a complete list of foods and beverages you can eat and drink. Contact a dietitian for more information. What foods should I avoid? Fruits Fruits canned with syrup. Vegetables Canned vegetables. Frozen vegetables with butter or cream sauce. Grains Refined white flour and flour products such as bread, pasta, snack foods, and cereals. Avoid all processed foods. Meats and other proteins Fatty cuts of meat. Poultry with skin. Breaded or fried meats. Processed meat. Avoid saturated fats. Dairy Full-fat yogurt, cheese, or milk. Beverages Sweetened drinks, such as soda or iced tea. The items listed above may not be a complete list of foods and beverages you should avoid. Contact a dietitian for more information. Questions to ask a health care provider Do I need to meet with a certified diabetes care and education specialist? Do I need to meet with a dietitian? What number can I call if I have questions? When are the best times to check my blood glucose? Where to find more information: American Diabetes Association: diabetes.org Academy of Nutrition and Dietetics: eatright[employer] of Diabetes and Digestive and Kidney Diseases: [employer]Sync[employer]si Association of Diabetes Care & Education Specialists: diabeteseducator.org Summary It is important to have healthy eating habits because your blood sugar (glucose) levels are greatly affected by what you eat and drink. It is important to use alcohol carefully. A healthy meal plan will help you manage your blood glucose and lower your risk of heart disease. Your health care provider may recommend that you work with a dietitian to make a meal plan that is best for you. This information is not intended to replace advice given to you by your health care provider. Make sure you discuss any  questions you have with your health care  provider. Document Revised: 10/25/2019 Document Reviewed: 10/25/2019 Elsevier Patient Education  2024 [employer]vier [employer].      Edwina Barth, MD  Primary Care at [facility]

## 2022-12-31 NOTE — Patient Instructions (Signed)

## 2023-02-01 ENCOUNTER — Other Ambulatory Visit (HOSPITAL_COMMUNITY): Payer: Self-pay

## 2023-04-15 ENCOUNTER — Ambulatory Visit: Payer: Self-pay | Admitting: Emergency Medicine
